# Patient Record
Sex: Female | Born: 1960 | Race: White | Hispanic: No | Marital: Married | Smoking: Never smoker
Health system: Southern US, Community
[De-identification: ages and names within clinical notes are randomized; demographics above are authoritative.]

## PROBLEM LIST (undated history)

## (undated) DIAGNOSIS — Z803 Family history of malignant neoplasm of breast: Secondary | ICD-10-CM

## (undated) DIAGNOSIS — M8580 Other specified disorders of bone density and structure, unspecified site: Secondary | ICD-10-CM

## (undated) DIAGNOSIS — Z8619 Personal history of other infectious and parasitic diseases: Secondary | ICD-10-CM

## (undated) DIAGNOSIS — I471 Supraventricular tachycardia, unspecified: Secondary | ICD-10-CM

## (undated) DIAGNOSIS — Z9889 Other specified postprocedural states: Secondary | ICD-10-CM

## (undated) DIAGNOSIS — M1990 Unspecified osteoarthritis, unspecified site: Secondary | ICD-10-CM

## (undated) DIAGNOSIS — R112 Nausea with vomiting, unspecified: Secondary | ICD-10-CM

## (undated) DIAGNOSIS — T4145XA Adverse effect of unspecified anesthetic, initial encounter: Secondary | ICD-10-CM

## (undated) DIAGNOSIS — T8859XA Other complications of anesthesia, initial encounter: Secondary | ICD-10-CM

## (undated) HISTORY — PX: OTHER SURGICAL HISTORY: SHX169

## (undated) HISTORY — DX: Unspecified osteoarthritis, unspecified site: M19.90

## (undated) HISTORY — DX: Supraventricular tachycardia: I47.1

## (undated) HISTORY — DX: Personal history of other infectious and parasitic diseases: Z86.19

## (undated) HISTORY — DX: Supraventricular tachycardia, unspecified: I47.10

## (undated) HISTORY — DX: Family history of malignant neoplasm of breast: Z80.3

---

## 1998-01-28 ENCOUNTER — Ambulatory Visit (HOSPITAL_COMMUNITY): Admission: RE | Admit: 1998-01-28 | Discharge: 1998-01-28 | Payer: Self-pay | Admitting: Obstetrics and Gynecology

## 1999-06-14 ENCOUNTER — Other Ambulatory Visit: Admission: RE | Admit: 1999-06-14 | Discharge: 1999-06-14 | Payer: Self-pay | Admitting: Obstetrics and Gynecology

## 2000-07-04 ENCOUNTER — Other Ambulatory Visit: Admission: RE | Admit: 2000-07-04 | Discharge: 2000-07-04 | Payer: Self-pay | Admitting: Obstetrics and Gynecology

## 2001-07-26 ENCOUNTER — Other Ambulatory Visit: Admission: RE | Admit: 2001-07-26 | Discharge: 2001-07-26 | Payer: Self-pay | Admitting: Obstetrics and Gynecology

## 2002-09-25 ENCOUNTER — Other Ambulatory Visit: Admission: RE | Admit: 2002-09-25 | Discharge: 2002-09-25 | Payer: Self-pay | Admitting: Obstetrics and Gynecology

## 2003-11-04 ENCOUNTER — Other Ambulatory Visit: Admission: RE | Admit: 2003-11-04 | Discharge: 2003-11-04 | Payer: Self-pay | Admitting: Obstetrics and Gynecology

## 2004-12-21 ENCOUNTER — Other Ambulatory Visit: Admission: RE | Admit: 2004-12-21 | Discharge: 2004-12-21 | Payer: Self-pay | Admitting: Obstetrics and Gynecology

## 2005-05-04 ENCOUNTER — Encounter: Admission: RE | Admit: 2005-05-04 | Discharge: 2005-05-04 | Payer: Self-pay | Admitting: Obstetrics and Gynecology

## 2005-05-23 ENCOUNTER — Encounter: Admission: RE | Admit: 2005-05-23 | Discharge: 2005-05-23 | Payer: Self-pay | Admitting: Obstetrics and Gynecology

## 2005-06-05 ENCOUNTER — Ambulatory Visit: Payer: Self-pay | Admitting: Pulmonary Disease

## 2005-06-06 ENCOUNTER — Ambulatory Visit: Payer: Self-pay | Admitting: Pulmonary Disease

## 2005-08-16 ENCOUNTER — Encounter: Payer: Self-pay | Admitting: Obstetrics and Gynecology

## 2006-05-07 ENCOUNTER — Encounter: Admission: RE | Admit: 2006-05-07 | Discharge: 2006-05-07 | Payer: Self-pay | Admitting: Obstetrics and Gynecology

## 2007-05-09 ENCOUNTER — Encounter: Admission: RE | Admit: 2007-05-09 | Discharge: 2007-05-09 | Payer: Self-pay | Admitting: Obstetrics and Gynecology

## 2008-04-29 ENCOUNTER — Ambulatory Visit: Payer: Self-pay | Admitting: Pulmonary Disease

## 2008-04-30 ENCOUNTER — Ambulatory Visit: Payer: Self-pay | Admitting: Pulmonary Disease

## 2008-04-30 DIAGNOSIS — M1990 Unspecified osteoarthritis, unspecified site: Secondary | ICD-10-CM | POA: Insufficient documentation

## 2008-04-30 DIAGNOSIS — B029 Zoster without complications: Secondary | ICD-10-CM | POA: Insufficient documentation

## 2008-04-30 LAB — CONVERTED CEMR LAB
Alkaline Phosphatase: 41 units/L (ref 39–117)
BUN: 16 mg/dL (ref 6–23)
Barbiturate Quant, Ur: NEGATIVE
Basophils Absolute: 0 10*3/uL (ref 0.0–0.1)
Benzodiazepines.: NEGATIVE
Bilirubin, Direct: 0.2 mg/dL (ref 0.0–0.3)
CO2: 30 meq/L (ref 19–32)
Direct LDL: 126.2 mg/dL
Eosinophils Absolute: 0.1 10*3/uL (ref 0.0–0.7)
GFR calc non Af Amer: 81 mL/min
HCT: 39.6 % (ref 36.0–46.0)
Hep B S Ab: POSITIVE — AB
Lymphocytes Relative: 39.7 % (ref 12.0–46.0)
Methadone: NEGATIVE
Monocytes Absolute: 0.2 10*3/uL (ref 0.1–1.0)
Neutro Abs: 1.6 10*3/uL (ref 1.4–7.7)
Opiate Screen, Urine: NEGATIVE
Phencyclidine (PCP): NEGATIVE
Platelets: 151 10*3/uL (ref 150–400)
Potassium: 4.2 meq/L (ref 3.5–5.1)
Propoxyphene: NEGATIVE
Sodium: 140 meq/L (ref 135–145)
Total Protein: 7 g/dL (ref 6.0–8.3)
Triglycerides: 40 mg/dL (ref 0–149)
VLDL: 8 mg/dL (ref 0–40)
WBC: 3.1 10*3/uL — ABNORMAL LOW (ref 4.5–10.5)

## 2008-05-06 ENCOUNTER — Telehealth: Payer: Self-pay | Admitting: Pulmonary Disease

## 2008-05-07 ENCOUNTER — Ambulatory Visit: Payer: Self-pay | Admitting: Pulmonary Disease

## 2008-05-07 LAB — CONVERTED CEMR LAB
Rubeola IgG: 2.83 — ABNORMAL HIGH
Varicella IgG: 4.31 — ABNORMAL HIGH

## 2008-05-11 ENCOUNTER — Encounter: Admission: RE | Admit: 2008-05-11 | Discharge: 2008-05-11 | Payer: Self-pay | Admitting: Obstetrics and Gynecology

## 2009-05-12 ENCOUNTER — Encounter: Admission: RE | Admit: 2009-05-12 | Discharge: 2009-05-12 | Payer: Self-pay | Admitting: Obstetrics and Gynecology

## 2009-05-13 ENCOUNTER — Ambulatory Visit: Payer: Self-pay | Admitting: Pulmonary Disease

## 2010-05-19 NOTE — Miscellaneous (Signed)
Summary: Orders Update  Clinical Lists Changes  Orders: Added new Test order of [employer]-Foot Right (73630TC) - Signed

## 2010-07-06 ENCOUNTER — Other Ambulatory Visit (HOSPITAL_COMMUNITY): Payer: Self-pay | Admitting: Obstetrics and Gynecology

## 2010-07-06 DIAGNOSIS — Z1231 Encounter for screening mammogram for malignant neoplasm of breast: Secondary | ICD-10-CM

## 2010-07-29 ENCOUNTER — Ambulatory Visit (HOSPITAL_COMMUNITY)
Admission: RE | Admit: 2010-07-29 | Discharge: 2010-07-29 | Disposition: A | Discharge status: Home / Self Care | Payer: 59 | Source: Ambulatory Visit | Attending: Obstetrics and Gynecology | Admitting: Obstetrics and Gynecology

## 2010-07-29 DIAGNOSIS — Z1231 Encounter for screening mammogram for malignant neoplasm of breast: Secondary | ICD-10-CM | POA: Insufficient documentation

## 2011-07-11 ENCOUNTER — Other Ambulatory Visit (HOSPITAL_COMMUNITY): Payer: Self-pay | Admitting: Obstetrics and Gynecology

## 2011-07-11 DIAGNOSIS — Z1231 Encounter for screening mammogram for malignant neoplasm of breast: Secondary | ICD-10-CM

## 2011-08-07 ENCOUNTER — Ambulatory Visit (HOSPITAL_COMMUNITY)
Admission: RE | Admit: 2011-08-07 | Discharge: 2011-08-07 | Disposition: A | Discharge status: Home / Self Care | Payer: 59 | Source: Ambulatory Visit | Attending: Obstetrics and Gynecology | Admitting: Obstetrics and Gynecology

## 2011-08-07 DIAGNOSIS — Z1231 Encounter for screening mammogram for malignant neoplasm of breast: Secondary | ICD-10-CM | POA: Insufficient documentation

## 2012-01-22 ENCOUNTER — Other Ambulatory Visit: Payer: Self-pay | Admitting: Pulmonary Disease

## 2012-01-22 DIAGNOSIS — Z0000 Encounter for general adult medical examination without abnormal findings: Secondary | ICD-10-CM

## 2012-01-25 ENCOUNTER — Other Ambulatory Visit (INDEPENDENT_AMBULATORY_CARE_PROVIDER_SITE_OTHER): Payer: 59

## 2012-01-25 DIAGNOSIS — Z0000 Encounter for general adult medical examination without abnormal findings: Secondary | ICD-10-CM

## 2012-01-25 LAB — CBC WITH DIFFERENTIAL/PLATELET
Basophils Absolute: 0 10*3/uL (ref 0.0–0.1)
Eosinophils Absolute: 0.1 10*3/uL (ref 0.0–0.7)
Lymphocytes Relative: 40.6 % (ref 12.0–46.0)
MCV: 95.4 fl (ref 78.0–100.0)
Neutrophils Relative %: 47.7 % (ref 43.0–77.0)
Platelets: 177 10*3/uL (ref 150.0–400.0)
RBC: 4.51 Mil/uL (ref 3.87–5.11)
RDW: 13.2 % (ref 11.5–14.6)

## 2012-01-25 LAB — LDL CHOLESTEROL, DIRECT: Direct LDL: 143.9 mg/dL

## 2012-01-25 LAB — LIPID PANEL: Triglycerides: 45 mg/dL (ref 0.0–149.0)

## 2012-01-25 LAB — HEPATIC FUNCTION PANEL
ALT: 12 U/L (ref 0–35)
AST: 19 U/L (ref 0–37)
Albumin: 4.2 g/dL (ref 3.5–5.2)
Bilirubin, Direct: 0.2 mg/dL (ref 0.0–0.3)
Total Bilirubin: 1 mg/dL (ref 0.3–1.2)

## 2012-01-25 LAB — BASIC METABOLIC PANEL
BUN: 16 mg/dL (ref 6–23)
CO2: 29 mEq/L (ref 19–32)
Calcium: 9.5 mg/dL (ref 8.4–10.5)
GFR: 76.51 mL/min (ref >60.00)
Potassium: 4.6 mEq/L (ref 3.5–5.1)
Sodium: 139 mEq/L (ref 135–145)

## 2012-01-25 LAB — URINALYSIS
Nitrite: NEGATIVE
Specific Gravity, Urine: 1.01 (ref 1.000–1.030)
Urine Glucose: NEGATIVE
Urobilinogen, UA: 0.2 (ref 0.0–1.0)

## 2012-01-26 ENCOUNTER — Ambulatory Visit (INDEPENDENT_AMBULATORY_CARE_PROVIDER_SITE_OTHER): Payer: 59 | Admitting: Pulmonary Disease

## 2012-01-26 ENCOUNTER — Ambulatory Visit (INDEPENDENT_AMBULATORY_CARE_PROVIDER_SITE_OTHER)
Admission: RE | Admit: 2012-01-26 | Discharge: 2012-01-26 | Disposition: A | Discharge status: Home / Self Care | Payer: 59 | Source: Ambulatory Visit | Attending: Pulmonary Disease | Admitting: Pulmonary Disease

## 2012-01-26 ENCOUNTER — Encounter: Payer: Self-pay | Admitting: *Deleted

## 2012-01-26 VITALS — BP 110/72 | HR 66 | Temp 97.7°F | Ht 65.0 in | Wt 162.2 lb

## 2012-01-26 DIAGNOSIS — E7800 Pure hypercholesterolemia, unspecified: Secondary | ICD-10-CM

## 2012-01-26 DIAGNOSIS — Z0000 Encounter for general adult medical examination without abnormal findings: Secondary | ICD-10-CM

## 2012-01-26 DIAGNOSIS — R1310 Dysphagia, unspecified: Secondary | ICD-10-CM | POA: Insufficient documentation

## 2012-01-26 DIAGNOSIS — Z8679 Personal history of other diseases of the circulatory system: Secondary | ICD-10-CM

## 2012-01-26 LAB — VITAMIN D 25 HYDROXY (VIT D DEFICIENCY, FRACTURES): Vit D, 25-Hydroxy: 32 ng/mL (ref 30–89)

## 2012-01-26 NOTE — Progress Notes (Signed)
Subjective:     Patient ID: Christy Morales, female   DOB: Aug 26, 1960, 51 y.o.   MRN: [id number]  HPI 43 y/o WF, wife of Dr. Marcelyn Morales, here for a CPX...   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 she enjoys excellent general medical health and her only complaint is that of intermittent but somewhat freq episodes of palpitations- see below.  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  Her CC= one month hx of intermittent swallowing difficulty, like she's having a hard time swallowing, described as a pressure sensation in upper esoph & throat- noted more w/ dry foods & helped sl by fluids; denies choking/ coughing/ vomiting, ?globus sensation?; states she tried Zyrtek but not Zantac;  We discussed further eval w/ Ba Swallow, & treatment w/ PPI vs H2Blocker and a combination relaxer for the cricopharyngeus (eg- Klonopin) but she doesn't want meds & prefers checking the BaSwallow first... (PE is neg w/o goiter, nodule, etc)    Hx Palpit> rare episodes of rapid tachycardia sound like PAT, usually resolve by coughing (valsalva), only occurs very rarely now 7 she knows to avoid caffeine etc...    Hyperlipidemia> on diet alone but wt up 20# & FLP is worse w/ TChol 232, TG 45, HDL 71, LDL 144    DJD> she had right knee arthroscopy in the past tear), denies recurrent problems...     +FamHx heart dis & breast cancer> mother had MI & CABG, and hx breast cancer; she gets yearly mammography & does monthly self exam etc; we reviewed coronary risk factors & her FLP but she wants to work on diet & wt reduction first...    We reviewed prob list, meds, xrays and labs> see below for updates >> she declined flu  vaccine... CXR 10/13 showed normal heart size, clear lungs & essentially WNL.Marland KitchenMarland Kitchen EKG 10/13 showed SBrady, rate54, wnl, NAD... LABS 10/13:  FLP- not at goals, on diet Rx;  Chems- wnl;  CBC- wnl;  TSH=2.14;  VitD=32;  UA- clear... Barium Swallow> pending          Problem List:    ?Globus sensation/ Dysphagia >> ~  10/13:  See above...  R/O PAROXYSMAL ATRIAL TACHYCARDIA (ICD-427.0) - she notes occas episodes of palpitations described as rapid and hard which she can eliminate by coughing... these occur several times per week  and last only seconds because she will cough & the paroxysm will stop... this has been going on for several months... she denies CP, dizziness, syncope, SOB/ change in DOE, edema... no prev hx of cardiac arrhythmia etc... we discussed PAT & how a valsalva will break the arrhythmia- she would require Holter vs Event recorder to diagnose & document; and consideration of BBlocker, Calcium channel blocker, etc... for now she would like to try eliminating caffeine & see how she does (also reminded to avoid decongestants). ~  baseline EKG shows NSR, p waves w/ sl variable morphology, no STTW abnormalities.  HYPERLIPIDEMIA >> on diet alone... ~  FLP 1/10 showed TChol 209, TG 40, HDL 70, LDL 126 ~  FLP 10/13 showed TChol 232, TG 45, HDL 71, LDL 144  BREAST CANCER, FAMILY HX (  ICD-V16.3) - Mother passed away at age 66 w/ MI & CABG, but she also had a hx of breast cancer... pt has very dense breast tissue by mammography and [facility].  DEGENERATIVE JOINT DISEASE (ICD-715.90) - she had a right knee arthroscopy by Christy Morales in the past (meniscus tear)...  Hx of SHINGLES (ICD-053.9) - left T9-10 level shingles eruption in 1997... she has not had the Zoster vaccine.  Health Maintenance: ~  GYN= Christy Morales w/ PAP due now... perimenopausal, not on hormone therapy... ~  [facility]- dense fibroglandular tissue, no lesions... +Fam  hx breast cancer in her mother who passes away at 59 from MI, heart disease... ~  BMD done at Gyn office due to +FamHx of osteoporosis in mother... initial BMD reported normal, f/u w/ min osteopenia... on Calcium, Vits... ~  Colonoscopy:  stools neg via her Gyn office... screening colon due ~age 21... ~  Vaccinations:  she has vaccination record at home...      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      >>DT booster given today 04/29/08...      >>PPD skin test placed - read 05/01/08 as negative... & CXR is clear.   Past Medical History  Diagnosis Date  . Paroxysmal supraventricular tachycardia   . Family history of breast cancer   . DJD (degenerative joint disease)   . History of shingles     Past Surgical History  Procedure Date  . Tubal pregnancy with d & c   . Right knee arthroscopy for meniscus tear     Dr. Rennis Morales    No outpatient encounter prescriptions on file as of 01/26/2012.    Allergies  Allergen Reactions  . Sulfamethoxazole W-Trimethoprim     REACTION: rash    Current Medications, Allergies, Past Medical History, Past Surgical History, Family History, and Social History were reviewed in [employer] record.   Review of Systems        The patient complains of palpitations.  The patient denies fever, chills, sweats, anorexia, fatigue, weakness, malaise, weight loss, sleep disorder, blurring, diplopia, eye irritation, eye discharge, vision loss, eye pain, photophobia, earache, ear discharge, tinnitus, decreased hearing, nasal congestion, nosebleeds, sore throat, hoarseness, chest pain, syncope, dyspnea on exertion, orthopnea, PND, peripheral edema, cough, dyspnea at rest, excessive sputum, hemoptysis, wheezing, pleurisy, nausea, vomiting, diarrhea, constipation, change in bowel habits, abdominal pain, melena, hematochezia, jaundice, gas/bloating, indigestion/heartburn,  dysphagia, odynophagia, dysuria, hematuria, urinary frequency, urinary hesitancy, nocturia, incontinence, back pain, joint pain, joint swelling, muscle cramps, muscle weakness, stiffness, arthritis, sciatica, restless legs, leg pain at night, leg pain with exertion, rash, itching, dryness, suspicious lesions, paralysis, paresthesias, seizures, tremors, vertigo, transient blindness, frequent falls, frequent headaches, difficulty walking, depression, anxiety, memory loss, confusion, cold intolerance, heat intolerance, polydipsia, polyphagia, polyuria, unusual weight change, abnormal bruising, bleeding, enlarged lymph nodes, urticaria, allergic rash, hay fever, and recurrent infections.     Objective:   Physical Exam    WD, WN, 51 y/o WF in NAD... GENERAL:  Alert & oriented; pleasant & cooperative... HEENT:  [place]/AT, EOM-wnl, PERRLA, EACs-clear, TMs-wnl, NOSE-clear, THROAT-clear & wnl. NECK:  Supple w/ full ROM; no JVD; normal carotid impulses w/o bruits; no thyromegaly or nodules palpated; no lymphadenopathy. CHEST:  Clear to P & A; without wheezes/ rales/ or rhonchi. HEART:  Regular Rhythm; without murmurs/ rubs/ or gallops. ABDOMEN:  Soft & nontender; normal bowel  sounds; no organomegaly or masses detected. EXT: without deformities or arthritic changes; no varicose veins/ venous insuffic/ or edema. NEURO:  CN's intact; motor testing normal; sensory testing normal; gait normal & balance OK. DERM:  No lesions noted; no rash etc...  RADIOLOGY DATA:  Reviewed in the EPIC EMR & discussed w/ the patient...  LABORATORY DATA:  Reviewed in the EPIC EMR & discussed w/ the patient...   Assessment:     CPX age 66>> Good general health...  ?Dysphagia vs Globus sensation>  We will pursue Ba swallow & she will consider med rx once this is done...  HxPAT>  Very rare episodes now, reviewed rec to avoid caffeine, etc...  Hyperlipid>  Committed to diet, exercise, wt reduction, does not want meds...  Need  for Colonoscopy>  She is 51 y/o now & we will refer to [facility] for colonoscopy...  Other medical issues as noted...     Plan:     Patient's Medications  New Prescriptions   No medications on file  Previous Medications   CALCIUM CARBONATE (OS-CAL) 1250 MG CHEWABLE TABLET    Chew 1 tablet by mouth daily.   MULTIPLE VITAMINS-MINERALS (MULTIVITAMIN & MINERAL PO)    Take one tablet 2-3 times per week  Modified Medications   No medications on file  Discontinued Medications   No medications on file

## 2012-01-26 NOTE — Patient Instructions (Addendum)
Christy Morales, it was great seeing you again...  Today we did your follow up physical exam & everything checked out OK...  We gave you a copy of your FASTING blood work>    Let's work on weight reduction w/ low chol, low fat diet and exercise program...  Today we did your follow up CXR & EKG> we will call you w/ the results...  We will sched a Barium Swallow for further evaluation of your swallowing difficulty...    If it is neg, I would rec a trial of Klonopin for the Cricopharyngeus muscle spasm...  We will also set up a GI appt w/ DrMagod for a colonoscopy (he may need to add an EGD to this if the swallowing prob persists)...  Call for any questions.Marland KitchenMarland Kitchen

## 2012-01-28 ENCOUNTER — Encounter: Payer: Self-pay | Admitting: Pulmonary Disease

## 2012-01-28 DIAGNOSIS — E7800 Pure hypercholesterolemia, unspecified: Secondary | ICD-10-CM | POA: Insufficient documentation

## 2012-01-28 DIAGNOSIS — Z8679 Personal history of other diseases of the circulatory system: Secondary | ICD-10-CM | POA: Insufficient documentation

## 2012-02-01 ENCOUNTER — Ambulatory Visit (HOSPITAL_COMMUNITY)
Admission: RE | Admit: 2012-02-01 | Discharge: 2012-02-01 | Disposition: A | Discharge status: Home / Self Care | Payer: 59 | Source: Ambulatory Visit | Attending: Pulmonary Disease | Admitting: Pulmonary Disease

## 2012-02-01 DIAGNOSIS — K224 Dyskinesia of esophagus: Secondary | ICD-10-CM | POA: Insufficient documentation

## 2012-02-01 DIAGNOSIS — R1310 Dysphagia, unspecified: Secondary | ICD-10-CM | POA: Insufficient documentation

## 2012-03-05 ENCOUNTER — Telehealth: Payer: Self-pay | Admitting: Pulmonary Disease

## 2012-03-05 NOTE — Telephone Encounter (Signed)
Will forward to SN as FYI 

## 2012-03-05 NOTE — Telephone Encounter (Signed)
SN is aware. 

## 2013-06-18 ENCOUNTER — Encounter (HOSPITAL_COMMUNITY): Payer: Self-pay | Admitting: Emergency Medicine

## 2013-06-18 ENCOUNTER — Emergency Department (HOSPITAL_COMMUNITY)
Admission: EM | Admit: 2013-06-18 | Discharge: 2013-06-18 | Disposition: A | Discharge status: Home / Self Care | Payer: 59 | Source: Home / Self Care | Attending: Family Medicine | Admitting: Family Medicine

## 2013-06-18 DIAGNOSIS — S81052A Open bite, left knee, initial encounter: Secondary | ICD-10-CM

## 2013-06-18 DIAGNOSIS — S81009A Unspecified open wound, unspecified knee, initial encounter: Secondary | ICD-10-CM

## 2013-06-18 DIAGNOSIS — S91009A Unspecified open wound, unspecified ankle, initial encounter: Secondary | ICD-10-CM

## 2013-06-18 DIAGNOSIS — S81809A Unspecified open wound, unspecified lower leg, initial encounter: Secondary | ICD-10-CM

## 2013-06-18 DIAGNOSIS — W540XXA Bitten by dog, initial encounter: Secondary | ICD-10-CM

## 2013-06-18 NOTE — ED Provider Notes (Signed)
CSN: [id number]     Arrival date & time 06/18/13  1528 History   First MD Initiated Contact with Patient 06/18/13 1644     Chief Complaint  Patient presents with  . Animal Bite   (Consider location/radiation/quality/duration/timing/severity/associated sxs/prior Treatment) HPI Comments: Patient states she was walking her dog today when she was approached and bitten on her left knee by her neighbors dog. The dog that bit her has been reported to her to have been fully immunized and is under quarantine with animal control. Her dog is being observed at her vet's office. She states her last tetanus booster was less than 5 years ago. She was wearing long pants at the time of injury and fabric of pants remained intact. She suffered two small superficial abrasions to the medial surface of her left knee. No active bleeding or puncture wounds.   The history is provided by the patient.    Past Medical History  Diagnosis Date  . Paroxysmal supraventricular tachycardia   . Family history of breast cancer   . DJD (degenerative joint disease)   . History of shingles    Past Surgical History  Procedure Laterality Date  . Tubal pregnancy with d & c    . Right knee arthroscopy for meniscus tear      Dr. Rennis ChrisSupple   History reviewed. No pertinent family history. History  Substance Use Topics  . Smoking status: Never Smoker   . Smokeless tobacco: Not on file  . Alcohol Use: Yes     Comment: social   OB History   Grav Para Term Preterm Abortions TAB SAB Ect Mult Living                 Review of Systems  All other systems reviewed and are negative.    Allergies  Penicillins and Sulfamethoxazole-trimethoprim  Home Medications   Current Outpatient Rx  Name  Route  Sig  Dispense  Refill  . calcium carbonate (OS-CAL) 1250 MG chewable tablet   Oral   Chew 1 tablet by mouth daily.         . Multiple Vitamins-Minerals (MULTIVITAMIN & MINERAL PO)      Take one tablet 2-3 times per week          BP 130/75  Pulse 59  Temp(Src) 97.9 F (36.6 C) (Oral)  Resp 16  SpO2 100% Physical Exam  Nursing note and vitals reviewed. Constitutional: She is oriented to person, place, and time. She appears well-developed and well-nourished. No distress.  HENT:  Head: Normocephalic and atraumatic.  Eyes: Conjunctivae are normal.  Cardiovascular: Normal rate.   Pulmonary/Chest: Effort normal.  Musculoskeletal: Normal range of motion.       Left knee: She exhibits ecchymosis. She exhibits normal range of motion, no effusion, no deformity, no laceration and no erythema.       Legs: No dysfunction of left knee  Neurological: She is alert and oriented to person, place, and time.  Skin: Skin is warm and dry.  Psychiatric: She has a normal mood and affect. Her behavior is normal.    ED Course  Procedures (including critical care time) Labs Review Labs Reviewed - No data to display Imaging Review No results found.   MDM   1. Dog bite of left knee    Abrasion left knee: patient's tetanus status is UTD and dog that bit patient is reported to have been UTD with regard to rabies immunization. Wounds are superficial and given that fabric of patient's  pants remained intact and that only 5% of dog bites become infected, I explained that her risk of infection is quite low. Reviewed wound care instructions for abrasions with patient and provided her with several packets of bacitracin ointment for use at home.   Jess Barters [place], [place] 06/18/13 [phone]

## 2013-06-18 NOTE — ED Notes (Addendum)
Pt  Reports  She  Was  Bitten  By a  Dog     Bite    l  Knee    Today  She  Was  Walking  Her  Dog  When  Neighbors  Dog  Attacked  Her  Dog then  Bit  Her       She  Reports  Animal  Control  Notified  Already   Dog  According to  [employer]  Has  Had  Immunizations   And  [employer]  [employer]      Are  Taking the  Dog  To  The  Vet

## 2013-06-19 NOTE — ED Provider Notes (Signed)
Medical screening examination/treatment/procedure(s) were performed by a resident physician or non-physician practitioner and as the supervising physician I was immediately available for consultation/collaboration.  Evan Corey, MD    Evan S Corey, MD 06/19/13 0748 

## 2013-09-15 LAB — HM MAMMOGRAPHY

## 2013-09-15 LAB — HM PAP SMEAR: HM PAP: NEGATIVE

## 2013-10-24 ENCOUNTER — Telehealth: Payer: Self-pay | Admitting: Pulmonary Disease

## 2013-10-24 NOTE — Telephone Encounter (Signed)
Pt would like to know if dr. Fabian Sharppanosh would accept her as a transfer pt from [facility]. Pt husband is a pcp at [facility].

## 2014-06-05 LAB — HM COLONOSCOPY

## 2014-06-12 ENCOUNTER — Ambulatory Visit (INDEPENDENT_AMBULATORY_CARE_PROVIDER_SITE_OTHER): Payer: 59 | Admitting: Family Medicine

## 2014-06-12 ENCOUNTER — Encounter: Payer: Self-pay | Admitting: Family Medicine

## 2014-06-12 VITALS — BP 129/78 | HR 76 | Temp 99.4°F | Ht 65.0 in | Wt 150.8 lb

## 2014-06-12 DIAGNOSIS — E7800 Pure hypercholesterolemia, unspecified: Secondary | ICD-10-CM

## 2014-06-12 DIAGNOSIS — E785 Hyperlipidemia, unspecified: Secondary | ICD-10-CM

## 2014-06-12 DIAGNOSIS — M8580 Other specified disorders of bone density and structure, unspecified site: Secondary | ICD-10-CM

## 2014-06-12 DIAGNOSIS — Z0000 Encounter for general adult medical examination without abnormal findings: Secondary | ICD-10-CM

## 2014-06-12 NOTE — Progress Notes (Signed)
Pre visit review using our clinic review tool, if applicable. No additional management support is needed unless otherwise documented below in the visit note. 

## 2014-06-12 NOTE — Patient Instructions (Signed)
Osteoporosis Throughout your life, your body breaks down old bone and replaces it with new bone. As you get older, your body does not replace bone as quickly as it breaks it down. By the age of 30 years, most people begin to gradually lose bone because of the imbalance between bone loss and replacement. Some people lose more bone than others. Bone loss beyond a specified normal degree is considered osteoporosis.  Osteoporosis affects the strength and durability of your bones. The inside of the ends of your bones and your flat bones, like the bones of your pelvis, look like honeycomb, filled with tiny open spaces. As bone loss occurs, your bones become less dense. This means that the open spaces inside your bones become bigger and the walls between these spaces become thinner. This makes your bones weaker. Bones of a person with osteoporosis can become so weak that they can break (fracture) during minor accidents, such as a simple fall. CAUSES  The following factors have been associated with the development of osteoporosis:  Smoking.  Drinking more than 2 alcoholic drinks several days per week.  Long-term use of certain medicines:  Corticosteroids.  Chemotherapy medicines.  Thyroid medicines.  Antiepileptic medicines.  Gonadal hormone suppression medicine.  Immunosuppression medicine.  Being underweight.  Lack of physical activity.  Lack of exposure to the sun. This can lead to vitamin D deficiency.  Certain medical conditions:  Certain inflammatory bowel diseases, such as Crohn disease and ulcerative colitis.  Diabetes.  Hyperthyroidism.  Hyperparathyroidism. RISK FACTORS Anyone can develop osteoporosis. However, the following factors can increase your risk of developing osteoporosis:  Gender--Women are at higher risk than men.  Age--Being older than 50 years increases your risk.  Ethnicity--White and Asian people have an increased risk.  Weight --Being extremely  underweight can increase your risk of osteoporosis.  Family history of osteoporosis--Having a family member who has developed osteoporosis can increase your risk. SYMPTOMS  Usually, people with osteoporosis have no symptoms.  DIAGNOSIS  Signs during a physical exam that may prompt your caregiver to suspect osteoporosis include:  Decreased height. This is usually caused by the compression of the bones that form your spine (vertebrae) because they have weakened and become fractured.  A curving or rounding of the upper back (kyphosis). To confirm signs of osteoporosis, your caregiver may request a procedure that uses 2 low-dose X-ray beams with different levels of energy to measure your bone mineral density (dual-energy X-ray absorptiometry [DXA]). Also, your caregiver may check your level of vitamin D. TREATMENT  The goal of osteoporosis treatment is to strengthen bones in order to decrease the risk of bone fractures. There are different types of medicines available to help achieve this goal. Some of these medicines work by slowing the processes of bone loss. Some medicines work by increasing bone density. Treatment also involves making sure that your levels of calcium and vitamin D are adequate. PREVENTION  There are things you can do to help prevent osteoporosis. Adequate intake of calcium and vitamin D can help you achieve optimal bone mineral density. Regular exercise can also help, especially resistance and weight-bearing activities. If you smoke, quitting smoking is an important part of osteoporosis prevention. MAKE SURE YOU:  Understand these instructions.  Will watch your condition.  Will get help right away if you are not doing well or get worse. FOR MORE INFORMATION www.osteo.org and www.nof.org Document Released: 01/11/2005 Document Revised: 07/29/2012 Document Reviewed: 03/18/2011 ExitCare Patient Information 2015 [employer]. This information is not   intended to replace advice  given to you by your health care provider. Make sure you discuss any questions you have with your health care provider.  

## 2014-06-13 NOTE — Assessment & Plan Note (Signed)
Recheck labs 

## 2014-06-13 NOTE — Progress Notes (Signed)
   Subjective:    Patient ID: Christy Morales, female    DOB: 01-03-1961, 54 y.o.   MRN: [id number]  HPI  Patient here to establish care.  No complaints.   Past Medical History  Diagnosis Date  . Paroxysmal supraventricular tachycardia   . Family history of breast cancer   . DJD (degenerative joint disease)   . History of shingles   . Osteoporosis     lower spine    Review of Systems  Constitutional: Negative for activity change, appetite change, fatigue and unexpected weight change.  Respiratory: Negative for cough and shortness of breath.   Cardiovascular: Negative for chest pain and palpitations.  Psychiatric/Behavioral: Negative for behavioral problems and dysphoric mood. The patient is not nervous/anxious.        Objective:    Physical Exam  Constitutional: She is oriented to person, place, and time. She appears well-developed and well-nourished. No distress.  HENT:  Right Ear: External ear normal.  Left Ear: External ear normal.  Nose: Nose normal.  Mouth/Throat: Oropharynx is clear and moist.  Eyes: EOM are normal. Pupils are equal, round, and reactive to light.  Neck: Normal range of motion. Neck supple.  Cardiovascular: Normal rate, regular rhythm and normal heart sounds.   No murmur heard. Pulmonary/Chest: Effort normal and breath sounds normal. No respiratory distress. She has no wheezes. She has no rales. She exhibits no tenderness.  Neurological: She is alert and oriented to person, place, and time.  Psychiatric: She has a normal mood and affect. Her behavior is normal. Judgment and thought content normal.    BP 129/78 mmHg  Pulse 76  Temp(Src) 99.4 F (37.4 C) (Oral)  Ht 5\' 5"  (1.651 m)  Wt 150 lb 12.8 oz (68.402 kg)  BMI 25.09 kg/m2  SpO2 100%  LMP  Wt Readings from Last 3 Encounters:  06/12/14 150 lb 12.8 oz (68.402 kg)  01/26/12 162 lb 3.2 oz (73.573 kg)  04/29/08 142 lb (64.411 kg)     Lab Results  Component Value Date   WBC 3.4* 01/25/2012     HGB 14.6 01/25/2012   HCT 43.0 01/25/2012   PLT 177.0 01/25/2012   GLUCOSE 89 01/25/2012   CHOL 232* 01/25/2012   TRIG 45.0 01/25/2012   HDL 71.40 01/25/2012   LDLDIRECT 143.9 01/25/2012   ALT 12 01/25/2012   AST 19 01/25/2012   NA 139 01/25/2012   K 4.6 01/25/2012   CL 104 01/25/2012   CREATININE 0.8 01/25/2012   BUN 16 01/25/2012   CO2 29 01/25/2012   TSH 2.14 01/25/2012    No results found.     Assessment & Plan:   Problem List Items Addressed This Visit    Osteopenia - Primary   Relevant Orders   Vitamin D (25 hydroxy)   Hypercholesterolemia    Recheck labs       Other Visit Diagnoses    Hyperlipidemia        Relevant Orders    Basic metabolic panel    CBC with Differential/Platelet    Hepatic function panel    Lipid panel    Preventative health care        Relevant Orders    HIV antibody        Loreen FreudYvonne Lowne, DO

## 2014-06-22 ENCOUNTER — Other Ambulatory Visit (INDEPENDENT_AMBULATORY_CARE_PROVIDER_SITE_OTHER): Payer: 59

## 2014-06-22 ENCOUNTER — Encounter: Payer: Self-pay | Admitting: Family Medicine

## 2014-06-22 DIAGNOSIS — Z0000 Encounter for general adult medical examination without abnormal findings: Secondary | ICD-10-CM

## 2014-06-22 DIAGNOSIS — M8580 Other specified disorders of bone density and structure, unspecified site: Secondary | ICD-10-CM

## 2014-06-22 DIAGNOSIS — E785 Hyperlipidemia, unspecified: Secondary | ICD-10-CM

## 2014-06-22 LAB — HEPATIC FUNCTION PANEL
ALBUMIN: 4.6 g/dL (ref 3.5–5.2)
ALT: 9 U/L (ref 0–35)
AST: 16 U/L (ref 0–37)
Alkaline Phosphatase: 49 U/L (ref 39–117)
BILIRUBIN DIRECT: 0.1 mg/dL (ref 0.0–0.3)
Total Bilirubin: 0.7 mg/dL (ref 0.2–1.2)
Total Protein: 7.4 g/dL (ref 6.0–8.3)

## 2014-06-22 LAB — CBC WITH DIFFERENTIAL/PLATELET
Basophils Absolute: 0 10*3/uL (ref 0.0–0.1)
Basophils Relative: 1.1 % (ref 0.0–3.0)
EOS PCT: 3.4 % (ref 0.0–5.0)
Eosinophils Absolute: 0.1 10*3/uL (ref 0.0–0.7)
HEMATOCRIT: 41.5 % (ref 36.0–46.0)
HEMOGLOBIN: 14.4 g/dL (ref 12.0–15.0)
LYMPHS ABS: 1.6 10*3/uL (ref 0.7–4.0)
Lymphocytes Relative: 46.4 % — ABNORMAL HIGH (ref 12.0–46.0)
MCHC: 34.6 g/dL (ref 30.0–36.0)
MCV: 92.6 fl (ref 78.0–100.0)
MONOS PCT: 6.9 % (ref 3.0–12.0)
Monocytes Absolute: 0.2 10*3/uL (ref 0.1–1.0)
NEUTROS ABS: 1.4 10*3/uL (ref 1.4–7.7)
Neutrophils Relative %: 42.2 % — ABNORMAL LOW (ref 43.0–77.0)
Platelets: 200 10*3/uL (ref 150.0–400.0)
RBC: 4.48 Mil/uL (ref 3.87–5.11)
RDW: 13.4 % (ref 11.5–15.5)
WBC: 3.4 10*3/uL — ABNORMAL LOW (ref 4.0–10.5)

## 2014-06-22 LAB — BASIC METABOLIC PANEL
BUN: 15 mg/dL (ref 6–23)
CALCIUM: 10 mg/dL (ref 8.4–10.5)
CHLORIDE: 105 meq/L (ref 96–112)
CO2: 31 mEq/L (ref 19–32)
CREATININE: 0.89 mg/dL (ref 0.40–1.20)
GFR: 69.95 mL/min (ref >60.00)
Glucose, Bld: 95 mg/dL (ref 70–99)
Potassium: 4.4 mEq/L (ref 3.5–5.1)
Sodium: 140 mEq/L (ref 135–145)

## 2014-06-22 LAB — VITAMIN D 25 HYDROXY (VIT D DEFICIENCY, FRACTURES): VITD: 42.04 ng/mL (ref 30.00–100.00)

## 2014-06-22 LAB — HIV ANTIBODY (ROUTINE TESTING W REFLEX): HIV: NONREACTIVE

## 2014-06-22 LAB — LIPID PANEL
CHOL/HDL RATIO: 3
CHOLESTEROL: 200 mg/dL (ref 0–200)
HDL: 66.6 mg/dL (ref >39.00)
LDL CALC: 117 mg/dL — AB (ref 0–99)
NonHDL: 133.4
TRIGLYCERIDES: 81 mg/dL (ref 0.0–149.0)
VLDL: 16.2 mg/dL (ref 0.0–40.0)

## 2014-07-22 ENCOUNTER — Encounter: Payer: Self-pay | Admitting: Family Medicine

## 2015-06-15 ENCOUNTER — Encounter: Payer: 59 | Admitting: Family Medicine

## 2016-01-20 ENCOUNTER — Ambulatory Visit (INDEPENDENT_AMBULATORY_CARE_PROVIDER_SITE_OTHER): Payer: Federal, State, Local not specified - PPO | Admitting: Family Medicine

## 2016-01-20 ENCOUNTER — Encounter: Payer: Self-pay | Admitting: Family Medicine

## 2016-01-20 VITALS — BP 121/76 | HR 51 | Temp 97.9°F | Resp 16 | Ht 65.0 in | Wt 155.8 lb

## 2016-01-20 DIAGNOSIS — Z0000 Encounter for general adult medical examination without abnormal findings: Secondary | ICD-10-CM | POA: Diagnosis not present

## 2016-01-20 DIAGNOSIS — Z803 Family history of malignant neoplasm of breast: Secondary | ICD-10-CM

## 2016-01-20 LAB — CBC WITH DIFFERENTIAL/PLATELET
BASOS ABS: 0 10*3/uL (ref 0.0–0.1)
Basophils Relative: 0.9 % (ref 0.0–3.0)
EOS ABS: 0.1 10*3/uL (ref 0.0–0.7)
Eosinophils Relative: 1.4 % (ref 0.0–5.0)
HEMATOCRIT: 42.5 % (ref 36.0–46.0)
Hemoglobin: 14.5 g/dL (ref 12.0–15.0)
LYMPHS PCT: 32.3 % (ref 12.0–46.0)
Lymphs Abs: 1.6 10*3/uL (ref 0.7–4.0)
MCHC: 34.2 g/dL (ref 30.0–36.0)
MCV: 93.8 fl (ref 78.0–100.0)
Monocytes Absolute: 0.3 10*3/uL (ref 0.1–1.0)
Monocytes Relative: 5.8 % (ref 3.0–12.0)
NEUTROS ABS: 3 10*3/uL (ref 1.4–7.7)
Neutrophils Relative %: 59.6 % (ref 43.0–77.0)
PLATELETS: 201 10*3/uL (ref 150.0–400.0)
RBC: 4.53 Mil/uL (ref 3.87–5.11)
RDW: 13.4 % (ref 11.5–15.5)
WBC: 5 10*3/uL (ref 4.0–10.5)

## 2016-01-20 LAB — POCT URINALYSIS DIPSTICK
Bilirubin, UA: NEGATIVE
Blood, UA: NEGATIVE
GLUCOSE UA: NEGATIVE
Ketones, UA: NEGATIVE
LEUKOCYTES UA: NEGATIVE
Nitrite, UA: NEGATIVE
Protein, UA: NEGATIVE
SPEC GRAV UA: 1.025
Urobilinogen, UA: NEGATIVE
pH, UA: 6

## 2016-01-20 LAB — COMPREHENSIVE METABOLIC PANEL
ALBUMIN: 4.4 g/dL (ref 3.5–5.2)
ALT: 9 U/L (ref 0–35)
AST: 18 U/L (ref 0–37)
Alkaline Phosphatase: 62 U/L (ref 39–117)
BILIRUBIN TOTAL: 0.7 mg/dL (ref 0.2–1.2)
BUN: 13 mg/dL (ref 6–23)
CALCIUM: 9.9 mg/dL (ref 8.4–10.5)
CO2: 31 meq/L (ref 19–32)
CREATININE: 0.78 mg/dL (ref 0.40–1.20)
Chloride: 102 mEq/L (ref 96–112)
GFR: 80.99 mL/min (ref >60.00)
Glucose, Bld: 92 mg/dL (ref 70–99)
Potassium: 3.8 mEq/L (ref 3.5–5.1)
Sodium: 141 mEq/L (ref 135–145)
TOTAL PROTEIN: 7.4 g/dL (ref 6.0–8.3)

## 2016-01-20 LAB — LIPID PANEL
CHOLESTEROL: 236 mg/dL — AB (ref 0–200)
HDL: 88.9 mg/dL (ref >39.00)
LDL Cholesterol: 138 mg/dL — ABNORMAL HIGH (ref 0–99)
NonHDL: 147.23
TRIGLYCERIDES: 48 mg/dL (ref 0.0–149.0)
Total CHOL/HDL Ratio: 3
VLDL: 9.6 mg/dL (ref 0.0–40.0)

## 2016-01-20 LAB — TSH: TSH: 2.42 u[IU]/mL (ref 0.35–4.50)

## 2016-01-20 NOTE — Patient Instructions (Signed)
Preventive Care for Adults, Female A healthy lifestyle and preventive care can promote health and wellness. Preventive health guidelines for women include the following key practices.  A routine yearly physical is a good way to check with your health care provider about your health and preventive screening. It is a chance to share any concerns and updates on your health and to receive a thorough exam.  Visit your dentist for a routine exam and preventive care every 6 months. Brush your teeth twice a day and floss once a day. Good oral hygiene prevents tooth decay and gum disease.  The frequency of eye exams is based on your age, health, family medical history, use of contact lenses, and other factors. Follow your health care provider's recommendations for frequency of eye exams.  Eat a healthy diet. Foods like vegetables, fruits, whole grains, low-fat dairy products, and lean protein foods contain the nutrients you need without too many calories. Decrease your intake of foods high in solid fats, added sugars, and salt. Eat the right amount of calories for you.Get information about a proper diet from your health care provider, if necessary.  Regular physical exercise is one of the most important things you can do for your health. Most adults should get at least 150 minutes of moderate-intensity exercise (any activity that increases your heart rate and causes you to sweat) each week. In addition, most adults need muscle-strengthening exercises on 2 or more days a week.  Maintain a healthy weight. The body mass index (BMI) is a screening tool to identify possible weight problems. It provides an estimate of body fat based on height and weight. Your health care provider can find your BMI and can help you achieve or maintain a healthy weight.For adults 20 years and older:  A BMI below 18.5 is considered underweight.  A BMI of 18.5 to 24.9 is normal.  A BMI of 25 to 29.9 is considered overweight.  A  BMI of 30 and above is considered obese.  Maintain normal blood lipids and cholesterol levels by exercising and minimizing your intake of saturated fat. Eat a balanced diet with plenty of fruit and vegetables. Blood tests for lipids and cholesterol should begin at age 45 and be repeated every 5 years. If your lipid or cholesterol levels are high, you are over 50, or you are at high risk for heart disease, you may need your cholesterol levels checked more frequently.Ongoing high lipid and cholesterol levels should be treated with medicines if diet and exercise are not working.  If you smoke, find out from your health care provider how to quit. If you do not use tobacco, do not start.  Lung cancer screening is recommended for adults aged 45-80 years who are at high risk for developing lung cancer because of a history of smoking. A yearly low-dose CT scan of the lungs is recommended for people who have at least a 30-pack-year history of smoking and are a current smoker or have quit within the past 15 years. A pack year of smoking is smoking an average of 1 pack of cigarettes a day for 1 year (for example: 1 pack a day for 30 years or 2 packs a day for 15 years). Yearly screening should continue until the smoker has stopped smoking for at least 15 years. Yearly screening should be stopped for people who develop a health problem that would prevent them from having lung cancer treatment.  If you are pregnant, do not drink alcohol. If you are  breastfeeding, be very cautious about drinking alcohol. If you are not pregnant and choose to drink alcohol, do not have more than 1 drink per day. One drink is considered to be 12 ounces (355 mL) of beer, 5 ounces (148 mL) of wine, or 1.5 ounces (44 mL) of liquor.  Avoid use of street drugs. Do not share needles with anyone. Ask for help if you need support or instructions about stopping the use of drugs.  High blood pressure causes heart disease and increases the risk  of stroke. Your blood pressure should be checked at least every 1 to 2 years. Ongoing high blood pressure should be treated with medicines if weight loss and exercise do not work.  If you are 55-79 years old, ask your health care provider if you should take aspirin to prevent strokes.  Diabetes screening is done by taking a blood sample to check your blood glucose level after you have not eaten for a certain period of time (fasting). If you are not overweight and you do not have risk factors for diabetes, you should be screened once every 3 years starting at age 45. If you are overweight or obese and you are 40-70 years of age, you should be screened for diabetes every year as part of your cardiovascular risk assessment.  Breast cancer screening is essential preventive care for women. You should practice "breast self-awareness." This means understanding the normal appearance and feel of your breasts and may include breast self-examination. Any changes detected, no matter how small, should be reported to a health care provider. Women in their 20s and 30s should have a clinical breast exam (CBE) by a health care provider as part of a regular health exam every 1 to 3 years. After age 40, women should have a CBE every year. Starting at age 40, women should consider having a mammogram (breast X-ray test) every year. Women who have a family history of breast cancer should talk to their health care provider about genetic screening. Women at a high risk of breast cancer should talk to their health care providers about having an MRI and a mammogram every year.  Breast cancer gene (BRCA)-related cancer risk assessment is recommended for women who have family members with BRCA-related cancers. BRCA-related cancers include breast, ovarian, tubal, and peritoneal cancers. Having family members with these cancers may be associated with an increased risk for harmful changes (mutations) in the breast cancer genes BRCA1 and  BRCA2. Results of the assessment will determine the need for genetic counseling and BRCA1 and BRCA2 testing.  Your health care provider may recommend that you be screened regularly for cancer of the pelvic organs (ovaries, uterus, and vagina). This screening involves a pelvic examination, including checking for microscopic changes to the surface of your cervix (Pap test). You may be encouraged to have this screening done every 3 years, beginning at age 21.  For women ages 30-65, health care providers may recommend pelvic exams and Pap testing every 3 years, or they may recommend the Pap and pelvic exam, combined with testing for human papilloma virus (HPV), every 5 years. Some types of HPV increase your risk of cervical cancer. Testing for HPV may also be done on women of any age with unclear Pap test results.  Other health care providers may not recommend any screening for nonpregnant women who are considered low risk for pelvic cancer and who do not have symptoms. Ask your health care provider if a screening pelvic exam is right for   you.  If you have had past treatment for cervical cancer or a condition that could lead to cancer, you need Pap tests and screening for cancer for at least 20 years after your treatment. If Pap tests have been discontinued, your risk factors (such as having a new sexual partner) need to be reassessed to determine if screening should resume. Some women have medical problems that increase the chance of getting cervical cancer. In these cases, your health care provider may recommend more frequent screening and Pap tests.  Colorectal cancer can be detected and often prevented. Most routine colorectal cancer screening begins at the age of 50 years and continues through age 75 years. However, your health care provider may recommend screening at an earlier age if you have risk factors for colon cancer. On a yearly basis, your health care provider may provide home test kits to check  for hidden blood in the stool. Use of a small camera at the end of a tube, to directly examine the colon (sigmoidoscopy or colonoscopy), can detect the earliest forms of colorectal cancer. Talk to your health care provider about this at age 50, when routine screening begins. Direct exam of the colon should be repeated every 5-10 years through age 75 years, unless early forms of precancerous polyps or small growths are found.  People who are at an increased risk for hepatitis B should be screened for this virus. You are considered at high risk for hepatitis B if:  You were born in a country where hepatitis B occurs often. Talk with your health care provider about which countries are considered high risk.  Your parents were born in a high-risk country and you have not received a shot to protect against hepatitis B (hepatitis B vaccine).  You have HIV or AIDS.  You use needles to inject street drugs.  You live with, or have sex with, someone who has hepatitis B.  You get hemodialysis treatment.  You take certain medicines for conditions like cancer, organ transplantation, and autoimmune conditions.  Hepatitis C blood testing is recommended for all people born from 1945 through 1965 and any individual with known risks for hepatitis C.  Practice safe sex. Use condoms and avoid high-risk sexual practices to reduce the spread of sexually transmitted infections (STIs). STIs include gonorrhea, chlamydia, syphilis, trichomonas, herpes, HPV, and human immunodeficiency virus (HIV). Herpes, HIV, and HPV are viral illnesses that have no cure. They can result in disability, cancer, and death.  You should be screened for sexually transmitted illnesses (STIs) including gonorrhea and chlamydia if:  You are sexually active and are younger than 24 years.  You are older than 24 years and your health care provider tells you that you are at risk for this type of infection.  Your sexual activity has changed  since you were last screened and you are at an increased risk for chlamydia or gonorrhea. Ask your health care provider if you are at risk.  If you are at risk of being infected with HIV, it is recommended that you take a prescription medicine daily to prevent HIV infection. This is called preexposure prophylaxis (PrEP). You are considered at risk if:  You are sexually active and do not regularly use condoms or know the HIV status of your partner(s).  You take drugs by injection.  You are sexually active with a partner who has HIV.  Talk with your health care provider about whether you are at high risk of being infected with HIV. If   you choose to begin PrEP, you should first be tested for HIV. You should then be tested every 3 months for as long as you are taking PrEP.  Osteoporosis is a disease in which the bones lose minerals and strength with aging. This can result in serious bone fractures or breaks. The risk of osteoporosis can be identified using a bone density scan. Women ages 67 years and over and women at risk for fractures or osteoporosis should discuss screening with their health care providers. Ask your health care provider whether you should take a calcium supplement or vitamin D to reduce the rate of osteoporosis.  Menopause can be associated with physical symptoms and risks. Hormone replacement therapy is available to decrease symptoms and risks. You should talk to your health care provider about whether hormone replacement therapy is right for you.  Use sunscreen. Apply sunscreen liberally and repeatedly throughout the day. You should seek shade when your shadow is shorter than you. Protect yourself by wearing long sleeves, pants, a wide-brimmed hat, and sunglasses year round, whenever you are outdoors.  Once a month, do a whole body skin exam, using a mirror to look at the skin on your back. Tell your health care provider of new moles, moles that have irregular borders, moles that  are larger than a pencil eraser, or moles that have changed in shape or color.  Stay current with required vaccines (immunizations).  Influenza vaccine. All adults should be immunized every year.  Tetanus, diphtheria, and acellular pertussis (Td, Tdap) vaccine. Pregnant women should receive 1 dose of Tdap vaccine during each pregnancy. The dose should be obtained regardless of the length of time since the last dose. Immunization is preferred during the 27th-36th week of gestation. An adult who has not previously received Tdap or who does not know her vaccine status should receive 1 dose of Tdap. This initial dose should be followed by tetanus and diphtheria toxoids (Td) booster doses every 10 years. Adults with an unknown or incomplete history of completing a 3-dose immunization series with Td-containing vaccines should begin or complete a primary immunization series including a Tdap dose. Adults should receive a Td booster every 10 years.  Varicella vaccine. An adult without evidence of immunity to varicella should receive 2 doses or a second dose if she has previously received 1 dose. Pregnant females who do not have evidence of immunity should receive the first dose after pregnancy. This first dose should be obtained before leaving the health care facility. The second dose should be obtained 4-8 weeks after the first dose.  Human papillomavirus (HPV) vaccine. Females aged 13-26 years who have not received the vaccine previously should obtain the 3-dose series. The vaccine is not recommended for use in pregnant females. However, pregnancy testing is not needed before receiving a dose. If a female is found to be pregnant after receiving a dose, no treatment is needed. In that case, the remaining doses should be delayed until after the pregnancy. Immunization is recommended for any person with an immunocompromised condition through the age of 61 years if she did not get any or all doses earlier. During the  3-dose series, the second dose should be obtained 4-8 weeks after the first dose. The third dose should be obtained 24 weeks after the first dose and 16 weeks after the second dose.  Zoster vaccine. One dose is recommended for adults aged 30 years or older unless certain conditions are present.  Measles, mumps, and rubella (MMR) vaccine. Adults born  before 1957 generally are considered immune to measles and mumps. Adults born in 1957 or later should have 1 or more doses of MMR vaccine unless there is a contraindication to the vaccine or there is laboratory evidence of immunity to each of the three diseases. A routine second dose of MMR vaccine should be obtained at least 28 days after the first dose for students attending postsecondary schools, health care workers, or international travelers. People who received inactivated measles vaccine or an unknown type of measles vaccine during 1963-1967 should receive 2 doses of MMR vaccine. People who received inactivated mumps vaccine or an unknown type of mumps vaccine before 1979 and are at high risk for mumps infection should consider immunization with 2 doses of MMR vaccine. For females of childbearing age, rubella immunity should be determined. If there is no evidence of immunity, females who are not pregnant should be vaccinated. If there is no evidence of immunity, females who are pregnant should delay immunization until after pregnancy. Unvaccinated health care workers born before 1957 who lack laboratory evidence of measles, mumps, or rubella immunity or laboratory confirmation of disease should consider measles and mumps immunization with 2 doses of MMR vaccine or rubella immunization with 1 dose of MMR vaccine.  Pneumococcal 13-valent conjugate (PCV13) vaccine. When indicated, a person who is uncertain of his immunization history and has no record of immunization should receive the PCV13 vaccine. All adults 65 years of age and older should receive this  vaccine. An adult aged 19 years or older who has certain medical conditions and has not been previously immunized should receive 1 dose of PCV13 vaccine. This PCV13 should be followed with a dose of pneumococcal polysaccharide (PPSV23) vaccine. Adults who are at high risk for pneumococcal disease should obtain the PPSV23 vaccine at least 8 weeks after the dose of PCV13 vaccine. Adults older than 56 years of age who have normal immune system function should obtain the PPSV23 vaccine dose at least 1 year after the dose of PCV13 vaccine.  Pneumococcal polysaccharide (PPSV23) vaccine. When PCV13 is also indicated, PCV13 should be obtained first. All adults aged 65 years and older should be immunized. An adult younger than age 65 years who has certain medical conditions should be immunized. Any person who resides in a nursing home or long-term care facility should be immunized. An adult smoker should be immunized. People with an immunocompromised condition and certain other conditions should receive both PCV13 and PPSV23 vaccines. People with human immunodeficiency virus (HIV) infection should be immunized as soon as possible after diagnosis. Immunization during chemotherapy or radiation therapy should be avoided. Routine use of PPSV23 vaccine is not recommended for American Indians, [place] Natives, or people younger than 65 years unless there are medical conditions that require PPSV23 vaccine. When indicated, people who have unknown immunization and have no record of immunization should receive PPSV23 vaccine. One-time revaccination 5 years after the first dose of PPSV23 is recommended for people aged 19-64 years who have chronic kidney failure, nephrotic syndrome, asplenia, or immunocompromised conditions. People who received 1-2 doses of PPSV23 before age 65 years should receive another dose of PPSV23 vaccine at age 65 years or later if at least 5 years have passed since the previous dose. Doses of PPSV23 are not  needed for people immunized with PPSV23 at or after age 65 years.  Meningococcal vaccine. Adults with asplenia or persistent complement component deficiencies should receive 2 doses of quadrivalent meningococcal conjugate (MenACWY-D) vaccine. The doses should be obtained   at least 2 months apart. Microbiologists working with certain meningococcal bacteria, [place] recruits, people at risk during an outbreak, and people who travel to or live in countries with a high rate of meningitis should be immunized. A first-year college student up through age 34 years who is living in a residence hall should receive a dose if she did not receive a dose on or after her 16th birthday. Adults who have certain high-risk conditions should receive one or more doses of vaccine.  Hepatitis A vaccine. Adults who wish to be protected from this disease, have certain high-risk conditions, work with hepatitis A-infected animals, work in hepatitis A research labs, or travel to or work in countries with a high rate of hepatitis A should be immunized. Adults who were previously unvaccinated and who anticipate close contact with an international adoptee during the first 60 days after arrival in the [place] from a country with a high rate of hepatitis A should be immunized.  Hepatitis B vaccine. Adults who wish to be protected from this disease, have certain high-risk conditions, may be exposed to blood or other infectious body fluids, are household contacts or sex partners of hepatitis B positive people, are clients or workers in certain care facilities, or travel to or work in countries with a high rate of hepatitis B should be immunized.  Haemophilus influenzae type b (Hib) vaccine. A previously unvaccinated person with asplenia or sickle cell disease or having a scheduled splenectomy should receive 1 dose of Hib vaccine. Regardless of previous immunization, a recipient of a hematopoietic stem cell transplant should receive a  3-dose series 6-12 months after her successful transplant. Hib vaccine is not recommended for adults with HIV infection. Preventive Services / Frequency Ages 35 to 4 years  Blood pressure check.** / Every 3-5 years.  Lipid and cholesterol check.** / Every 5 years beginning at age 60.  Clinical breast exam.** / Every 3 years for women in their 71s and 10s.  BRCA-related cancer risk assessment.** / For women who have family members with a BRCA-related cancer (breast, ovarian, tubal, or peritoneal cancers).  Pap test.** / Every 2 years from ages 76 through 26. Every 3 years starting at age 61 through age 76 or 93 with a history of 3 consecutive normal Pap tests.  HPV screening.** / Every 3 years from ages 37 through ages 60 to 51 with a history of 3 consecutive normal Pap tests.  Hepatitis C blood test.** / For any individual with known risks for hepatitis C.  Skin self-exam. / Monthly.  Influenza vaccine. / Every year.  Tetanus, diphtheria, and acellular pertussis (Tdap, Td) vaccine.** / Consult your health care provider. Pregnant women should receive 1 dose of Tdap vaccine during each pregnancy. 1 dose of Td every 10 years.  Varicella vaccine.** / Consult your health care provider. Pregnant females who do not have evidence of immunity should receive the first dose after pregnancy.  HPV vaccine. / 3 doses over 6 months, if 93 and younger. The vaccine is not recommended for use in pregnant females. However, pregnancy testing is not needed before receiving a dose.  Measles, mumps, rubella (MMR) vaccine.** / You need at least 1 dose of MMR if you were born in 1957 or later. You may also need a 2nd dose. For females of childbearing age, rubella immunity should be determined. If there is no evidence of immunity, females who are not pregnant should be vaccinated. If there is no evidence of immunity, females who are  pregnant should delay immunization until after pregnancy.  Pneumococcal  13-valent conjugate (PCV13) vaccine.** / Consult your health care provider.  Pneumococcal polysaccharide (PPSV23) vaccine.** / 1 to 2 doses if you smoke cigarettes or if you have certain conditions.  Meningococcal vaccine.** / 1 dose if you are age 68 to 8 years and a Market researcher living in a residence hall, or have one of several medical conditions, you need to get vaccinated against meningococcal disease. You may also need additional booster doses.  Hepatitis A vaccine.** / Consult your health care provider.  Hepatitis B vaccine.** / Consult your health care provider.  Haemophilus influenzae type b (Hib) vaccine.** / Consult your health care provider. Ages 7 to 53 years  Blood pressure check.** / Every year.  Lipid and cholesterol check.** / Every 5 years beginning at age 25 years.  Lung cancer screening. / Every year if you are aged 11-80 years and have a 30-pack-year history of smoking and currently smoke or have quit within the past 15 years. Yearly screening is stopped once you have quit smoking for at least 15 years or develop a health problem that would prevent you from having lung cancer treatment.  Clinical breast exam.** / Every year after age 48 years.  BRCA-related cancer risk assessment.** / For women who have family members with a BRCA-related cancer (breast, ovarian, tubal, or peritoneal cancers).  Mammogram.** / Every year beginning at age 41 years and continuing for as long as you are in good health. Consult with your health care provider.  Pap test.** / Every 3 years starting at age 65 years through age 37 or 70 years with a history of 3 consecutive normal Pap tests.  HPV screening.** / Every 3 years from ages 72 years through ages 60 to 40 years with a history of 3 consecutive normal Pap tests.  Fecal occult blood test (FOBT) of stool. / Every year beginning at age 21 years and continuing until age 5 years. You may not need to do this test if you get  a colonoscopy every 10 years.  Flexible sigmoidoscopy or colonoscopy.** / Every 5 years for a flexible sigmoidoscopy or every 10 years for a colonoscopy beginning at age 35 years and continuing until age 48 years.  Hepatitis C blood test.** / For all people born from [birth year] through 1966 and any individual with known risks for hepatitis C.  Skin self-exam. / Monthly.  Influenza vaccine. / Every year.  Tetanus, diphtheria, and acellular pertussis (Tdap/Td) vaccine.** / Consult your health care provider. Pregnant women should receive 1 dose of Tdap vaccine during each pregnancy. 1 dose of Td every 10 years.  Varicella vaccine.** / Consult your health care provider. Pregnant females who do not have evidence of immunity should receive the first dose after pregnancy.  Zoster vaccine.** / 1 dose for adults aged 30 years or older.  Measles, mumps, rubella (MMR) vaccine.** / You need at least 1 dose of MMR if you were born in 1957 or later. You may also need a second dose. For females of childbearing age, rubella immunity should be determined. If there is no evidence of immunity, females who are not pregnant should be vaccinated. If there is no evidence of immunity, females who are pregnant should delay immunization until after pregnancy.  Pneumococcal 13-valent conjugate (PCV13) vaccine.** / Consult your health care provider.  Pneumococcal polysaccharide (PPSV23) vaccine.** / 1 to 2 doses if you smoke cigarettes or if you have certain conditions.  Meningococcal vaccine.** /  Consult your health care provider.  Hepatitis A vaccine.** / Consult your health care provider.  Hepatitis B vaccine.** / Consult your health care provider.  Haemophilus influenzae type b (Hib) vaccine.** / Consult your health care provider. Ages 64 years and over  Blood pressure check.** / Every year.  Lipid and cholesterol check.** / Every 5 years beginning at age 23 years.  Lung cancer screening. / Every year if you  are aged 16-80 years and have a 30-pack-year history of smoking and currently smoke or have quit within the past 15 years. Yearly screening is stopped once you have quit smoking for at least 15 years or develop a health problem that would prevent you from having lung cancer treatment.  Clinical breast exam.** / Every year after age 74 years.  BRCA-related cancer risk assessment.** / For women who have family members with a BRCA-related cancer (breast, ovarian, tubal, or peritoneal cancers).  Mammogram.** / Every year beginning at age 44 years and continuing for as long as you are in good health. Consult with your health care provider.  Pap test.** / Every 3 years starting at age 58 years through age 22 or 39 years with 3 consecutive normal Pap tests. Testing can be stopped between 65 and 70 years with 3 consecutive normal Pap tests and no abnormal Pap or HPV tests in the past 10 years.  HPV screening.** / Every 3 years from ages 64 years through ages 70 or 61 years with a history of 3 consecutive normal Pap tests. Testing can be stopped between 65 and 70 years with 3 consecutive normal Pap tests and no abnormal Pap or HPV tests in the past 10 years.  Fecal occult blood test (FOBT) of stool. / Every year beginning at age 40 years and continuing until age 27 years. You may not need to do this test if you get a colonoscopy every 10 years.  Flexible sigmoidoscopy or colonoscopy.** / Every 5 years for a flexible sigmoidoscopy or every 10 years for a colonoscopy beginning at age 7 years and continuing until age 32 years.  Hepatitis C blood test.** / For all people born from [birth year] through 1966 and any individual with known risks for hepatitis C.  Osteoporosis screening.** / A one-time screening for women ages 30 years and over and women at risk for fractures or osteoporosis.  Skin self-exam. / Monthly.  Influenza vaccine. / Every year.  Tetanus, diphtheria, and acellular pertussis (Tdap/Td)  vaccine.** / 1 dose of Td every 10 years.  Varicella vaccine.** / Consult your health care provider.  Zoster vaccine.** / 1 dose for adults aged 35 years or older.  Pneumococcal 13-valent conjugate (PCV13) vaccine.** / Consult your health care provider.  Pneumococcal polysaccharide (PPSV23) vaccine.** / 1 dose for all adults aged 46 years and older.  Meningococcal vaccine.** / Consult your health care provider.  Hepatitis A vaccine.** / Consult your health care provider.  Hepatitis B vaccine.** / Consult your health care provider.  Haemophilus influenzae type b (Hib) vaccine.** / Consult your health care provider. ** Family history and personal history of risk and conditions may change your health care provider's recommendations.   This information is not intended to replace advice given to you by your health care provider. Make sure you discuss any questions you have with your health care provider.   Document Released: 05/30/2001 Document Revised: 04/24/2014 Document Reviewed: 08/29/2010 Elsevier Interactive Patient Education [employer].

## 2016-01-20 NOTE — Progress Notes (Signed)
Subjective:     Christy Morales is a 55 y.o. female and is here for a comprehensive physical exam. The patient reports no problems.  Social History   Social History  . Marital status: Married    Spouse name: Dr. Marcelyn BruinsKeith Joines  . Number of children: N/A  . 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 Smoking status: Never Smoker  . Smokeless tobacco: Never Used  . Alcohol use Yes     Comment: social  . Drug use: No  . Sexual activity: Yes    Partners: Male   Other Topics Concern  . Not on file   Social History Narrative  . No narrative on file   Health Maintenance  Topic Date Due  . INFLUENZA VACCINE  01/19/2017 (Originally 11/16/2015)  . MAMMOGRAM  01/19/2017 (Originally 09/16/2015)  . Hepatitis C Screening  01/19/2017 (Originally Mar 28, 1961)  . PAP SMEAR  09/15/2016  . TETANUS/TDAP  04/29/2018  . COLONOSCOPY  06/05/2024  . HIV Screening  Completed    The following portions of the patient's history were reviewed and updated as appropriate:  She  has a past medical history of DJD (degenerative joint disease); Family history of breast cancer; History of shingles; Osteoporosis; and Paroxysmal supraventricular tachycardia (HCC). She  does not have any pertinent problems on file. She  has a past surgical history that includes tubal pregnancy with D & C and right knee arthroscopy for meniscus tear. Her family history includes Breast cancer in her mother; Breast cancer (age of onset: 6856) in her sister; Heart disease in her father and mother; High Cholesterol in her father and mother; Hyperparathyroidism in her sister; Osteoporosis in her maternal grandmother and sister. She  reports that she has never smoked. She has never used smokeless tobacco. She reports that she drinks alcohol. She reports that she does not use drugs. She has a current medication list which includes the following prescription(s): calcium  citrate-vitamin d and multiple vitamins-minerals. Current Outpatient Prescriptions on File Prior to Visit  Medication Sig Dispense Refill  . calcium citrate-vitamin D (CITRACAL+D) 315-200 MG-UNIT per tablet Take 2 tablets by mouth 2 (two) times daily.    . Multiple Vitamins-Minerals (MULTIVITAMIN & MINERAL PO) Take one tablet 2-3 times per week     No current facility-administered medications on file prior to visit.    She is allergic to penicillins and sulfamethoxazole-trimethoprim..  Review of Systems Review of Systems  Constitutional: Negative for activity change, appetite change and fatigue.  HENT: Negative for hearing loss, congestion, tinnitus and ear discharge.  dentist [id number] Eyes: Negative for visual disturbance (see optho q1y -- vision corrected to 20/20 with glasses).  Respiratory: Negative for cough, chest tightness and shortness of breath.   Cardiovascular: Negative for chest pain, palpitations and leg swelling.  Gastrointestinal: Negative for abdominal pain, diarrhea, constipation and abdominal distention.  Genitourinary: Negative for urgency, frequency, decreased urine volume and difficulty urinating.  Musculoskeletal: Negative for back pain, arthralgias and gait problem.  Skin: Negative for color change, pallor and rash.  Neurological: Negative for dizziness, light-headedness, numbness and headaches.  Hematological: Negative for adenopathy. Does not bruise/bleed easily.  Psychiatric/Behavioral: Negative for suicidal ideas, confusion, sleep disturbance, self-injury, dysphoric mood, decreased concentration and agitation.       Objective:    BP 121/76 (BP Location: Left Arm, Patient Position: Sitting, Cuff Size: Normal)   Pulse (!) 51   Temp 97.9  F (36.6 C) (Oral)   Resp 16   Ht 5\' 5"  (1.651 m)   Wt 155 lb 12.8 oz (70.7 kg)   SpO2 100%   BMI 25.93 kg/m  General appearance: alert, cooperative, appears stated age and no distress Head: Normocephalic, without obvious  abnormality, atraumatic Eyes: conjunctivae/corneas clear. PERRL, EOM's intact. Fundi benign. Ears: normal TM's and external ear canals both ears Nose: Nares normal. Septum midline. Mucosa normal. No drainage or sinus tenderness. Throat: lips, mucosa, and tongue normal; teeth and gums normal Neck: no adenopathy, no carotid bruit, no JVD, supple, symmetrical, trachea midline and thyroid not enlarged, symmetric, no tenderness/mass/nodules Back: symmetric, no curvature. ROM normal. No CVA tenderness. Lungs: clear to auscultation bilaterally Breasts: gyn Heart: regular rate and rhythm, S1, S2 normal, no murmur, click, rub or gallop Abdomen: soft, non-tender; bowel sounds normal; no masses,  no organomegaly Pelvic: deferred--gyn Extremities: extremities normal, atraumatic, no cyanosis or edema Pulses: 2+ and symmetric Skin: Skin color, texture, turgor normal. No rashes or lesions Lymph nodes: Cervical, supraclavicular, and axillary nodes normal. Neurologic: Alert and oriented X 3, normal strength and tone. Normal symmetric reflexes. Normal coordination and gait    Assessment:    Healthy female exam.      Plan:    ghm utd Check labs See After Visit Summary for Counseling Recommendations    1. Family history of breast cancer   - Ambulatory referral to Genetics  2. Preventative health care see above - Lipid panel - Comprehensive metabolic panel - CBC with Differential/Platelet - POCT urinalysis dipstick - TSH

## 2016-01-20 NOTE — Progress Notes (Signed)
Pre visit review using our clinic review tool, if applicable. No additional management support is needed unless otherwise documented below in the visit note. 

## 2016-01-21 ENCOUNTER — Encounter: Payer: Self-pay | Admitting: Family Medicine

## 2016-01-26 ENCOUNTER — Encounter: Payer: Self-pay | Admitting: Genetic Counselor

## 2016-01-26 ENCOUNTER — Telehealth: Payer: Self-pay | Admitting: Genetic Counselor

## 2016-01-26 NOTE — Telephone Encounter (Signed)
Appt scheduled w/[name] for 11/16 at 2pm. Pt aware to arrive 15-30 minutes early. Demographics verified. Letter mailed to the pt.

## 2016-03-02 ENCOUNTER — Ambulatory Visit (HOSPITAL_BASED_OUTPATIENT_CLINIC_OR_DEPARTMENT_OTHER): Payer: Federal, State, Local not specified - PPO | Admitting: Genetic Counselor

## 2016-03-02 ENCOUNTER — Other Ambulatory Visit: Payer: Federal, State, Local not specified - PPO

## 2016-03-02 DIAGNOSIS — Z800 Family history of malignant neoplasm of digestive organs: Secondary | ICD-10-CM | POA: Diagnosis not present

## 2016-03-02 DIAGNOSIS — H10413 Chronic giant papillary conjunctivitis, bilateral: Secondary | ICD-10-CM | POA: Diagnosis not present

## 2016-03-02 DIAGNOSIS — Z315 Encounter for genetic counseling: Secondary | ICD-10-CM | POA: Diagnosis not present

## 2016-03-02 DIAGNOSIS — H47323 Drusen of optic disc, bilateral: Secondary | ICD-10-CM | POA: Diagnosis not present

## 2016-03-02 DIAGNOSIS — H4423 Degenerative myopia, bilateral: Secondary | ICD-10-CM | POA: Diagnosis not present

## 2016-03-02 DIAGNOSIS — Z803 Family history of malignant neoplasm of breast: Secondary | ICD-10-CM | POA: Diagnosis not present

## 2016-03-03 ENCOUNTER — Encounter: Payer: Self-pay | Admitting: Genetic Counselor

## 2016-03-03 DIAGNOSIS — Z803 Family history of malignant neoplasm of breast: Secondary | ICD-10-CM | POA: Insufficient documentation

## 2016-03-03 NOTE — Progress Notes (Signed)
REFERRING PROVIDER: [name], DO [address] [place], [place] [address]  PRIMARY PROVIDER:  [name], DO  PRIMARY REASON FOR VISIT:  1. Family history of breast cancer in first degree relative   2. Family history of stomach cancer      HISTORY OF PRESENT ILLNESS:   Christy Morales, a 55 y.o. female, was seen for a [place] cancer genetics consultation at the request of Dr. Carollee Herter due to a family history of breast and other cancers.  Christy Morales presents to clinic today to discuss the possibility of a hereditary predisposition to cancer, genetic testing, and to further clarify her future cancer risks, as well as potential cancer risks for family members.    Christy Morales is a 55 y.o. female with no personal history of cancer.     HORMONAL RISK FACTORS:  Menarche was at age 35.  First live birth at age 77.  OCP use for approximately 8 years.  Ovaries intact: yes.  Hysterectomy: no.  Menopausal status: postmenopausal.  HRT use: 0 years. Colonoscopy: yes; first colonoscopy in 03/2015 - found 2 small polyps and per her report, she is on a 5- year schedule. Mammogram within the last year: yes, she will go on Tuesday for her next mammogram; reports high breast density. Number of breast biopsies: 0. Up to date with pelvic exams:  yes. 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 DJD (degenerative joint disease)   . Family history of breast cancer   . History of shingles   . Osteoporosis    lower spine  . Paroxysmal supraventricular tachycardia (HCC)     Past Surgical History:  Procedure Laterality Date  . right knee arthroscopy for meniscus tear     Dr. Onnie Graham  . tubal pregnancy with D & C      Social History   Social History  .  Marital status: Married    Spouse name: Dr. Danton Sewer  . Number of children: N/A  . 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 Smoking status: Never Smoker  . Smokeless tobacco: Never Used  . Alcohol use 1.2 oz/week    2 Glasses of wine per week  . Drug use: No  . Sexual activity: Yes    Partners: Male   Other Topics Concern  . None   Social History Narrative  . None     FAMILY HISTORY:  We obtained a detailed, 4-generation family history.  Significant diagnoses are listed below: Family History  Problem Relation Age of Onset  . High Cholesterol Father   . Heart disease Father   . Esophageal cancer Father 80    smoker and heavy drinker  . Stomach cancer Father 61    smoker and heavy drinker  . Breast cancer Mother 31  . High Cholesterol Mother   . Heart disease Mother   . Heart attack Mother     d. 43y  . Breast cancer Sister 71    d. 50y; took fertility hormones  . Other Sister     hx cervical ablation  . Osteoporosis Maternal Grandmother   . Osteoporosis Sister   . Hyperparathyroidism Sister  adenoma  . Cancer Maternal Grandfather     oral cancer dx 88s; smoker and SL tob user  . Pneumonia Paternal Grandmother     d. 25s  . Aneurysm Paternal Grandfather     brain; d. 78s    Christy Morales has one son and one daughter, ages 110 and 24.  She has three full sisters and one full brother.  One sister was diagnosed with breast cancer at 4 and she died of recurrence at 52.  Christy Morales other siblings are currently ages 13-60 and have not had cancer.  She reports that her other two sisters have had genetic counseling, but not genetic testing, as the genetic counselor told them that the family history of breast cancer was postmenopausal and was likely not demonstrative of a hereditary cancer syndrome.    Christy Morales. [place] mother was diagnosed with breast cancer at 67.  She passed away at 65 from  complications following a heart attack.  Christy Morales mother was an only child.  Christy Morales maternal grandmother passed away in her 13s and had osteoporosis.  Christy Morales grandfather passed away in his 13s from failure to thrive.  He was diagnosed with an oral cancer in his 63s, and had a history of smoking and smokeless tobacco use.  Christy Morales has limited information for her maternal great aunts/uncles and great grandparents.  Christy Morales father was diagnosed with esophageal and stomach cancers around the age of 42 and passed away shortly after.  He had a history of smoking and heavy alcohol use.  He had one full brother who died of suicide in his late 7s.  This brother had one daughter, and she is not currently in touch with the family.  Christy Morales paternal grandmother died of pneumonia in her 68s.  Her grandfather died of a brain aneurysm in his 84s.  Christy Morales has limited information for her paternal great aunts/uncles and great grandparents.  Christy Morales reports no known family history of genetic testing for hereditary cancer risks.  Patient's maternal ancestors are of [place] and [place] descent, and paternal ancestors are of [place] descent. There is no reported Ashkenazi Jewish ancestry. There is no known consanguinity.  GENETIC COUNSELING ASSESSMENT: Christy Morales is a 55 y.o. female with a family history of breast cancer.  We, therefore, discussed and recommended the following at today's visit.   DISCUSSION: We reviewed the characteristics, features and inheritance patterns of hereditary cancer syndromes. We discussed that only approximately 5-10% of cancer is caused by a gene mutation.  We discussed that, for many of these hereditary cancers, we can pick up on certain "red flags" in someone's personal and/or family history of cancer.  These red flags include: early age of cancer diagnosis, diagnosis of multiple primary cancers in one individual, multiple family members who have been diagnosed  with the same (or related) cancer and in multiple generations of a family, and presence of rare cancers in a family, among others.  We reviewed that Christy Morales's family history seems to be reassuring, in that the breast cancer history was diagnosed at older ages (over age 19).  However, we also discussed that Christy Morales's family is much smaller in size.  We also discussed genetic testing, including the appropriate family members to test, the process of testing, insurance coverage and turn-around-time for results. We discussed the implications of a negative, positive and/or variant of uncertain significant result.   We discussed with Christy Morales that the family history is not  highly consistent with a familial hereditary cancer syndrome, and we feel she is at low risk to harbor a gene mutation associated with such a condition. Thus, Christy Morales does not meet her insurance criteria for genetic testing.  However, she is still interested in genetic testing, so we discussed self-pay options for genetic testing.  She would like to proceed with $250 self-pay genetic testing through [facility].  Thus, we recommended she pursue genetic testing via the 42-gene [facility] Common Hereditary Cancers Panel (Breast, Gyn, GI) through [facility].  The 42-gene [facility] Common Hereditary Cancers Panel (Breast, Gyn, GI) performed by [facility] [place]) includes sequencing and/or deletion/duplication analysis for the following genes: APC, ATM, AXIN2, BARD1, BMPR1A, BRCA1, BRCA2, BRIP1, CDH1, CDKN2A, CHEK2, DICER1, EPCAM, GREM1, KIT, MEN1, MLH1, MSH2, MSH6, MUTYH, NBN, NF1, PALB2, PDGFRA, PMS2, POLD1, POLE, PTEN, RAD50, RAD51C, RAD51D, SDHA, SDHB, SDHC, SDHD, SMAD4, SMARCA4, STK11, TP53, TSC1, TSC2, and VHL.  Based on the patient's personal and family history, statistical models Baker Janus model and IBIS/Tyrer-Cuzick)  and literature data were used to estimate her risk of developing breast cancer. These  estimate her lifetime risk of developing breast cancer to be approximately 20.2% to 25.6%. This estimation does not take into account any genetic testing results.  The patient's lifetime breast cancer risk is a preliminary estimate based on available information using one of several models endorsed by the [place] (ACS). The ACS recommends consideration of breast MRI screening as an adjunct to mammography for patients at high risk (defined as 20% or greater lifetime risk). A more detailed breast cancer risk assessment can be considered, if clinically indicated.   PLAN: After considering the risks, benefits, and limitations, Christy Morales. [place]  provided informed consent to pursue genetic testing and the blood sample was sent to [facility] for analysis of the 42-gene [facility] Common Hereditary Cancers Panel (Breast, Gyn, GI). Results should be available within approximately 2-3 weeks' time, at which point they will be disclosed by telephone to Christy Morales. [place], as will any additional recommendations warranted by these results. Christy Morales. [place] will receive a summary of her genetic counseling visit and a copy of her results once available. This information will also be available in Epic. We encouraged Christy Morales. [place] to remain in contact with cancer genetics annually so that we can continuously update the family history and inform her of any changes in cancer genetics and testing that may be of benefit for her family. Christy Morales. Brun questions were answered to her satisfaction today. Our contact information was provided should additional questions or concerns arise.  Thank you for the referral and allowing [place] to share in the care of your patient.   Jeanine Luz, Christy Morales, [facility] Certified Genetic Counselor [email or web address] Phone: [phone]  The patient was seen for a total of 60 minutes in face-to-face genetic counseling.  This patient was discussed with Drs. Magrinat, Lindi Adie and/or Burr Medico who agrees with the  above.    _______________________________________________________________________ For Office Staff:  Number of people involved in session: 2 Was an Intern/ student involved with case: yes

## 2016-03-07 DIAGNOSIS — Z1231 Encounter for screening mammogram for malignant neoplasm of breast: Secondary | ICD-10-CM | POA: Diagnosis not present

## 2016-03-07 DIAGNOSIS — Z13820 Encounter for screening for osteoporosis: Secondary | ICD-10-CM | POA: Diagnosis not present

## 2016-03-15 DIAGNOSIS — Z6826 Body mass index (BMI) 26.0-26.9, adult: Secondary | ICD-10-CM | POA: Diagnosis not present

## 2016-03-15 DIAGNOSIS — Z01419 Encounter for gynecological examination (general) (routine) without abnormal findings: Secondary | ICD-10-CM | POA: Diagnosis not present

## 2016-03-17 ENCOUNTER — Telehealth: Payer: Self-pay | Admitting: Genetic Counselor

## 2016-03-17 NOTE — Telephone Encounter (Signed)
Discussed with Ms. 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  Discussed that MUTYH-associated polyposis (MAP) is a condition that is inherited in a recessive pattern, thus, someone must inherit a mutation in BOTH copies of the gene to be considered to be at a high risk for colon polyps and colon cancer.  Discussed that individuals who have one MUTYH gene mutation may or may not be at a somewhat increased risk for colon polyps and cancer.  National Comprehensive Cancer Network guidelines suggest that affected individuals with a first-degree relative begin colonscopies at age 56 and get those every 5 years.  Ms. Shelle IronClance does not have a first-degree relative with colon cancer, but her father did have a history of esophageal and stomach cancers.  Thus, it may make sense to be more cautious and get her colonoscopies every 5 years.  She is currently on a 5-year schedule; she sees Dr. Ewing SchleinMagod.  Recommended genetic testing for her siblings and children who each have a 50% to have inherited this same mutation.  We also just want to ensure that none of these relatives have two MUTYH gene mutations.  No other gene mutations or any variants of uncertain significance (VUSes) were found on the 42-gene [facility] Common Hereditary Cancers Panel (Breast, Gyn, GI).  We are unaware of any increased risk for breast cancer caused by a single MUTYH gene mutation, at this time.  Ms. Shelle IronClance should follow her doctors recommendations for future cancer screening.  I will email her a copy of her results, a results letter, and a family letter to help share this information with relatives.  She knows she is welcome to call with any questions.

## 2016-03-21 ENCOUNTER — Ambulatory Visit: Payer: Self-pay | Admitting: Genetic Counselor

## 2016-03-21 DIAGNOSIS — Z800 Family history of malignant neoplasm of digestive organs: Secondary | ICD-10-CM

## 2016-03-21 DIAGNOSIS — Z803 Family history of malignant neoplasm of breast: Secondary | ICD-10-CM

## 2016-03-21 DIAGNOSIS — Z1379 Encounter for other screening for genetic and chromosomal anomalies: Secondary | ICD-10-CM

## 2016-03-21 DIAGNOSIS — Z1589 Genetic susceptibility to other disease: Secondary | ICD-10-CM

## 2016-03-21 DIAGNOSIS — Z809 Family history of malignant neoplasm, unspecified: Secondary | ICD-10-CM

## 2016-04-04 ENCOUNTER — Other Ambulatory Visit: Payer: Self-pay | Admitting: Obstetrics and Gynecology

## 2016-04-04 DIAGNOSIS — Z9189 Other specified personal risk factors, not elsewhere classified: Secondary | ICD-10-CM

## 2016-04-14 ENCOUNTER — Encounter: Payer: Self-pay | Admitting: Genetic Counselor

## 2016-04-14 DIAGNOSIS — Z1379 Encounter for other screening for genetic and chromosomal anomalies: Secondary | ICD-10-CM | POA: Insufficient documentation

## 2016-04-14 DIAGNOSIS — Z1589 Genetic susceptibility to other disease: Secondary | ICD-10-CM | POA: Insufficient documentation

## 2016-04-14 DIAGNOSIS — Z0000 Encounter for general adult medical examination without abnormal findings: Secondary | ICD-10-CM | POA: Insufficient documentation

## 2016-04-14 NOTE — Progress Notes (Signed)
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 Furniss was previously seen in the [facility] due to a family of cancer and concerns regarding a hereditary predisposition to cancer. Please refer to our prior cancer genetics clinic note from March 02, 2016 for more information regarding Ms. Elicker's medical, social and family histories, and our assessment and recommendations, at the time. Ms. Adney recent genetic test results were disclosed to her, as were recommendations warranted by these results. These results and recommendations are discussed in more detail below.   FAMILY HISTORY:  We obtained a detailed, 4-generation family history.  Significant diagnoses are listed below: Family History  Problem Relation Age of Onset  . High Cholesterol Father   . Heart disease Father   . Esophageal cancer Father 14    smoker and heavy drinker  . Stomach cancer Father 56    smoker and heavy drinker  . Breast cancer Mother 56  . High Cholesterol Mother   . Heart disease Mother   . Heart attack Mother     d. 69y  . Breast cancer Sister 56    d. 90y; took fertility hormones  . Other Sister     hx cervical ablation  . Osteoporosis Maternal Grandmother   . Osteoporosis Sister   . Hyperparathyroidism Sister     adenoma  . Cancer Maternal Grandfather 56     oral cancer dx 56s; smoker and SL tob user  . Pneumonia Paternal Grandmother 56s 56s     d. 56s  . Aneurysm Paternal Grandfather     brain; d. 56s    Patient's maternal ancestors are of [place] and [place] descent, and paternal ancestors are of [place] descent. There is no reported Ashkenazi Jewish ancestry. There is no known consanguinity.  GENETIC TEST RESULTS: At the time of Ms. Dexter's visit on 03/02/16, we recommended she pursue  genetic testing of the 42-gene [facility] Common Hereditary Cancers Panel (Breast, Gyn, GI) through [facility] in [place], [place]. This test included sequencing and/or deletion/duplication analysis of the following genes: APC, ATM, AXIN2, BARD1, BMPR1A, BRCA1, BRCA2, BRIP1, CDH1, CDKN2A, CHEK2, DICER1, EPCAM, GREM1, KIT, MEN1, MLH1, MSH2, MSH6, MUTYH, NBN, NF1, PALB2, PDGFRA, PMS2, POLD1, POLE, PTEN, RAD50, RAD51C, RAD51D, SDHA, SDHB, SDHC, SDHD, SMAD4, SMARCA4, STK11, TP53, TSC1, TSC2, and VHL. Ms. Clere was called recently with her genetic test results. Genetic testing identified a single, heterozygous pathogenic gene mutation in the MUTYH gene called "c.1187G>A (p.Gly396Asp)".  Since Ms. Burby has only one pathogenic mutation in MUTYH, she is NOT affected with MUTYH-associated polyposis (MAP), but instead is a carrier. A copy of the test report will be scanned into Epic and will be located under the Molecular Pathology section of the Results Review tab.   SCREENING RECOMMENDATIONS: We discussed the implications of a heterozygous MUTYH mutation for Ms. Stiehl, and discussed who else in the family should have genetic testing. We recommended Ms. Strawderman follow management guidelines for heterozygous MUTYH mutations; all of which are outlined below. These can be coordinated by Ms. Everson's GI doctor or her primary provider.   Personal history of colon cancer  Follow instructions provided by your physician based on your personal history.  Do not have a personal history of colon cancer but have a parent/sibling/child with colon cancer: Colonoscopy every 5 years starting at age 71 or 61  years younger than the earliest age of onset, whichever is younger.  Do not have a personal history of colon cancer but do not have a parent/sibling/child with colon cancer: Colonoscopy every 5 years starting at age 71.  FAMILY MEMBERS: Since we now know the mutation in Ms. Putman, we can test at-risk relatives to  determine whether or not they have inherited the mutation and are at increased risk for cancer. We will be happy to meet with any of the family members or refer them to a genetic counselor in their local area. To locate genetic counselors in other cities, individuals can visit the website of the [employer] ([email or web address]) and search for a Oncologist by zip code.   Ms. Jean children and siblings have a 50% chance to have this same MUTYH gene mutation, and, thus, will need to have genetic counseling and testing.  We do not yet know what side of the family this mutation may be coming from, so Ms. Mojica should let relatives on both her maternal and paternal sides know about this mutation and the recommendation for them to also undergo genetic counseling and testing.  FOLLOW-UP: We strongly encouraged Ms. Kiely to remain in contact with [place] in cancer genetics on an annual basis so we can update Ms. Lodato's personal and family histories, and inform her of advances in cancer genetics that may be of benefit for the entire family. Ms. Brossard knows she is also welcome to call with any questions or concerns, at any time.   Jeanine Luz, MS, [place] Certified Genetic Counselor Phone: [phone]

## 2016-04-26 ENCOUNTER — Encounter: Payer: Self-pay | Admitting: Physician Assistant

## 2016-04-26 ENCOUNTER — Ambulatory Visit (INDEPENDENT_AMBULATORY_CARE_PROVIDER_SITE_OTHER): Payer: Federal, State, Local not specified - PPO | Admitting: Physician Assistant

## 2016-04-26 ENCOUNTER — Ambulatory Visit (HOSPITAL_BASED_OUTPATIENT_CLINIC_OR_DEPARTMENT_OTHER)
Admission: RE | Admit: 2016-04-26 | Discharge: 2016-04-26 | Disposition: A | Discharge status: Home / Self Care | Payer: Federal, State, Local not specified - PPO | Source: Ambulatory Visit | Attending: Physician Assistant | Admitting: Physician Assistant

## 2016-04-26 VITALS — BP 117/70 | HR 81 | Temp 100.6°F | Resp 16 | Ht 65.0 in | Wt 156.0 lb

## 2016-04-26 DIAGNOSIS — J189 Pneumonia, unspecified organism: Secondary | ICD-10-CM | POA: Insufficient documentation

## 2016-04-26 DIAGNOSIS — J069 Acute upper respiratory infection, unspecified: Secondary | ICD-10-CM | POA: Diagnosis present

## 2016-04-26 DIAGNOSIS — J181 Lobar pneumonia, unspecified organism: Secondary | ICD-10-CM

## 2016-04-26 MED ORDER — AZITHROMYCIN 250 MG PO TABS: ORAL_TABLET | ORAL | 0 refills | Status: DC

## 2016-04-26 MED ORDER — ALBUTEROL SULFATE HFA 108 (90 BASE) MCG/ACT IN AERS: 2.0000 | INHALATION_SPRAY | Freq: Four times a day (QID) | RESPIRATORY_TRACT | 0 refills | Status: DC | PRN

## 2016-04-26 MED ORDER — BENZONATATE 100 MG PO CAPS: 100.0000 mg | ORAL_CAPSULE | Freq: Two times a day (BID) | ORAL | 0 refills | Status: DC | PRN

## 2016-04-26 MED ORDER — ALBUTEROL SULFATE (2.5 MG/3ML) 0.083% IN NEBU: 2.5000 mg | INHALATION_SOLUTION | Freq: Four times a day (QID) | RESPIRATORY_TRACT | 0 refills | Status: DC | PRN

## 2016-04-26 NOTE — Progress Notes (Addendum)
Subjective:    Patient ID: Christy Morales, female    DOB: 1960/06/28, 56 y.o.   MRN: [id number]  HPI  Christy Morales is a 56 y/o female who complains of chest and nasal congestion, cough, and fever, started on Sunday, she has been taking ibuprofen, Dayquil and Nyquil. She states that she has had non productive cough. Difficulty breathing at times, especially when she is laying down. Developed a fever of 101 this morning, which made her concerned and is why she came in today. Appetite variable. No history asthma or PNA. Did not take a flu shot this year. 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  Review of Systems  See HPI  Past Medical History:  Diagnosis Date  . DJD (degenerative joint disease)   . Family history of breast cancer   . History of shingles   . Osteoporosis    lower spine  . Paroxysmal supraventricular tachycardia [facility](HCC)      Social History   Social History  . Marital status: Married    Spouse name: Dr. Marcelyn BruinsKeith Brueggemann  . Number of children: N/A  . 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 Smoking status: Never Smoker  . Smokeless tobacco: Never Used  . Alcohol use 1.2 oz/week    2 Glasses of wine per week  . Drug use: No  . Sexual activity: Yes    Partners: Male   Other Topics Concern  . Not on file   Social History Narrative  . No narrative on file    Past Surgical History:  Procedure Laterality Date  . right knee arthroscopy for meniscus tear     Dr. Rennis ChrisSupple  . tubal pregnancy with D & C      Family History  Problem Relation Age of Onset  . High Cholesterol Father   . Heart disease Father   . Esophageal cancer Father 2172    smoker and heavy drinker  . Stomach cancer Father 572    smoker and heavy drinker  . Breast cancer Mother 3269  . High Cholesterol Mother   . Heart disease Mother   . Heart attack Mother     d. 3772y  . Breast cancer Sister 7856    d. 59y; took  fertility hormones  . Other Sister     hx cervical ablation  . Osteoporosis Maternal Grandmother   . Osteoporosis Sister   . Hyperparathyroidism Sister     adenoma  . Cancer Maternal Grandfather     oral cancer dx 9780s; smoker and SL tob user  . Pneumonia Paternal Grandmother     d. 30s  . Aneurysm Paternal Grandfather     brain; d. 7170s    Allergies  Allergen Reactions  . Penicillins   . Sulfamethoxazole-Trimethoprim     REACTION: rash    Current Outpatient Prescriptions on File Prior to Visit  Medication Sig Dispense Refill  . calcium citrate-vitamin D (CITRACAL+D) 315-200 MG-UNIT per tablet Take 2 tablets by mouth 2 (two) times daily.    . Multiple Vitamins-Minerals (MULTIVITAMIN & MINERAL PO) Take one tablet 2-3 times per week     No current facility-administered medications on file prior to visit.     BP 117/70 (BP Location: Right Arm, Cuff Size: Normal)   Pulse 81   Temp (!) 100.6 F (38.1 C) (Oral)   Resp 16   Ht 5'  5" (1.651 m)   Wt 156 lb (70.8 kg)   SpO2 93%   BMI 25.96 kg/m      Objective:   Physical Exam  Constitutional: Vital signs are normal. She is cooperative.  HENT:  Head: Normocephalic and atraumatic.  Right Ear: Tympanic membrane, external ear and ear canal normal. Tympanic membrane is not erythematous, not retracted and not bulging.  Left Ear: Tympanic membrane, external ear and ear canal normal. Tympanic membrane is not erythematous, not retracted and not bulging.  Mouth/Throat: Uvula is midline and mucous membranes are normal. Posterior oropharyngeal edema and posterior oropharyngeal erythema present.  Eyes: Conjunctivae are normal.  Neck: Trachea normal.  Cardiovascular: Normal rate, regular rhythm and normal heart sounds.   Pulmonary/Chest: Effort normal and breath sounds normal. No accessory muscle usage. No respiratory distress. She has no wheezes. Rhonchi: occasional ronchi throughout lung fields. She has no rales.  Neurological: She is  alert.  Nursing note and vitals reviewed.   CXR PA and lateral: RLL pneumonia     Assessment & Plan:  Community acquired pneumonia of RLL of lung (HCC) Unable to test for influenza, as we are currently out of rapid testing supplies, however she is out of the treatment window given today is day 4. Given rapidly worsening of symptoms and history, will r/o PNA with chest xray.   Addendum: chest xray shows RLL PNA. Start z-pack. Patient notified of results. She states that her daughter has a nebulizer machine and she would prefer to have albuterol solution instead of an albuterol inhaler. Also told patient to follow up with [place] in one week for re-check. I also told her that ultimately she will need another CXR in 3-4 weeks to assess if PNA has cleared. Patient verbalized understanding.  Jarold Motto PA-C 04/26/16

## 2016-04-26 NOTE — Patient Instructions (Signed)
It was great meeting you today!  Please go to the first floor to complete your chest xray, we will call you with the results and begin an antibiotic after we review the imaging.  Tessalon Perles as needed for cough.  Call us if your symptoms don't improve by beginning of next week, sooner if they worsen.

## 2016-04-26 NOTE — Progress Notes (Signed)
Pre visit review using our clinic review tool, if applicable. No additional management support is needed unless otherwise documented below in the visit note. 

## 2016-04-27 ENCOUNTER — Other Ambulatory Visit: Payer: Federal, State, Local not specified - PPO

## 2016-04-28 ENCOUNTER — Telehealth: Payer: Self-pay | Admitting: Family Medicine

## 2016-04-28 DIAGNOSIS — J189 Pneumonia, unspecified organism: Secondary | ICD-10-CM

## 2016-04-28 DIAGNOSIS — J181 Lobar pneumonia, unspecified organism: Principal | ICD-10-CM

## 2016-04-28 NOTE — Telephone Encounter (Addendum)
Relation to WU:JWJXpt:self Call back [phone]   Reason for call:  Patient requesting nebulizer machine Rx due to maching breaking please send Rx [facility] or [facility], please advice

## 2016-04-28 NOTE — Telephone Encounter (Signed)
Can we send one in for her?  She was in Wednesday and saw [place] was [place] with pneumonia.  She was prescribed albuterol solution.

## 2016-04-28 NOTE — Telephone Encounter (Signed)
Don't think ins will pay for it for pneumonia but we will try

## 2016-05-01 NOTE — Telephone Encounter (Signed)
Will call [facility] tomorrow about how to order via epic

## 2016-05-01 NOTE — Telephone Encounter (Signed)
Left message on machine that we will send in for a machine at [facility] but they may not pay.

## 2016-05-02 ENCOUNTER — Other Ambulatory Visit: Payer: Self-pay | Admitting: Family Medicine

## 2016-05-02 ENCOUNTER — Encounter: Payer: Self-pay | Admitting: Physician Assistant

## 2016-05-02 ENCOUNTER — Ambulatory Visit (INDEPENDENT_AMBULATORY_CARE_PROVIDER_SITE_OTHER): Payer: Federal, State, Local not specified - PPO | Admitting: Family Medicine

## 2016-05-02 VITALS — BP 98/72 | HR 51 | Temp 97.8°F | Wt 155.2 lb

## 2016-05-02 DIAGNOSIS — J9801 Acute bronchospasm: Secondary | ICD-10-CM

## 2016-05-02 DIAGNOSIS — J181 Lobar pneumonia, unspecified organism: Secondary | ICD-10-CM

## 2016-05-02 DIAGNOSIS — J189 Pneumonia, unspecified organism: Secondary | ICD-10-CM

## 2016-05-02 DIAGNOSIS — Z1159 Encounter for screening for other viral diseases: Secondary | ICD-10-CM

## 2016-05-02 MED ORDER — PREDNISONE 10 MG PO TABS: ORAL_TABLET | ORAL | 0 refills | Status: DC

## 2016-05-02 NOTE — Progress Notes (Signed)
Pre visit review using our clinic review tool, if applicable. No additional management support is needed unless otherwise documented below in the visit note. 

## 2016-05-02 NOTE — Patient Instructions (Signed)
  Please have a repeat xray done in 2 weeks    Community-Acquired Pneumonia, Adult Introduction Pneumonia is an infection of the lungs. One type of pneumonia can happen while a person is in a hospital. A different type can happen when a person is not in a hospital (community-acquired pneumonia). It is easy for this kind to spread from person to person. It can spread to you if you breathe near an infected person who coughs or sneezes. Some symptoms include:  A dry cough.  A wet (productive) cough.  Fever.  Sweating.  Chest pain. Follow these instructions at home:  Take over-the-counter and prescription medicines only as told by your doctor.  Only take cough medicine if you are losing sleep.  If you were prescribed an antibiotic medicine, take it as told by your doctor. Do not stop taking the antibiotic even if you start to feel better.  Sleep with your head and neck raised (elevated). You can do this by putting a few pillows under your head, or you can sleep in a recliner.  Do not use tobacco products. These include cigarettes, chewing tobacco, and e-cigarettes. If you need help quitting, ask your doctor.  Drink enough water to keep your pee (urine) clear or pale yellow. A shot (vaccine) can help prevent pneumonia. Shots are often suggested for:  People older than 57 years of age.  People older than 57 years of age:  Who are having cancer treatment.  Who have long-term (chronic) lung disease.  Who have problems with their body's defense system (immune system). You may also prevent pneumonia if you take these actions:  Get the flu (influenza) shot every year.  Go to the dentist as often as told.  Wash your hands often. If soap and water are not available, use hand sanitizer. Contact a doctor if:  You have a fever.  You lose sleep because your cough medicine does not help. Get help right away if:  You are short of breath and it gets worse.  You have more chest  pain.  Your sickness gets worse. This is very serious if:  You are an older adult.  Your body's defense system is weak.  You cough up blood. This information is not intended to replace advice given to you by your health care provider. Make sure you discuss any questions you have with your health care provider. Document Released: 09/20/2007 Document Revised: 09/09/2015 Document Reviewed: 07/29/2014  2017 Elsevier

## 2016-05-02 NOTE — Telephone Encounter (Signed)
Spoke with [facility] and they stated that they did get the order.  Lmom that [facility] was [facility] and to call back with any questions or concerns.

## 2016-05-02 NOTE — Progress Notes (Unsigned)
Patient ID: Christy Morales, female    DOB: 07/22/1960  Age: 56 y.o. MRN: [id number]    Subjective:  Subjective  HPI Christy Morales presents for ***  Review of Systems  History Past Medical History:  Diagnosis Date  . DJD (degenerative joint disease)   . Family history of breast cancer   . History of shingles   . Osteoporosis    lower spine  . Paroxysmal supraventricular tachycardia (HCC)     She has a past surgical history that includes tubal pregnancy with D & C and right knee arthroscopy for meniscus tear.   Her family history includes Aneurysm in her paternal grandfather; Breast cancer (age of onset: 1656) in her sister; Breast cancer (age of onset: 3669) in her mother; Cancer in her maternal grandfather; Esophageal cancer (age of onset: 4172) in her father; Heart attack in her mother; Heart disease in her father and mother; High Cholesterol in her father and mother; Hyperparathyroidism in her sister; Osteoporosis in her maternal grandmother and sister; Other in her sister; Pneumonia in her paternal grandmother; Stomach cancer (age of onset: 4172) in her father.She reports that she has never smoked. She has never used smokeless tobacco. She reports that she drinks about 1.2 oz of alcohol per week . She reports that she does not use drugs.  Current Outpatient Prescriptions on File Prior to Visit  Medication Sig Dispense Refill  . albuterol (PROVENTIL) (2.5 MG/3ML) 0.083% nebulizer solution Take 3 mLs (2.5 mg total) by nebulization every 6 (six) hours as needed for wheezing or shortness of breath. 75 mL 0  . azithromycin (ZITHROMAX) 250 MG tablet Take two tabs on day one, then one tab daily for 4 days 6 tablet 0  . benzonatate (TESSALON) 100 MG capsule Take 1 capsule (100 mg total) by mouth 2 (two) times daily as needed for cough. 20 capsule 0  . calcium citrate-vitamin D (CITRACAL+D) 315-200 MG-UNIT per tablet Take 2 tablets by mouth 2 (two) times daily.    . Multiple Vitamins-Minerals  (MULTIVITAMIN & MINERAL PO) Take one tablet 2-3 times per week     No current facility-administered medications on file prior to visit.      Objective:  Objective  Physical Exam There were no vitals taken for this visit. Wt Readings from Last 3 Encounters:  04/26/16 156 lb (70.8 kg)  01/20/16 155 lb 12.8 oz (70.7 kg)  06/12/14 150 lb 12.8 oz (68.4 kg)     Lab Results  Component Value Date   WBC 5.0 01/20/2016   HGB 14.5 01/20/2016   HCT 42.5 01/20/2016   PLT 201.0 01/20/2016   GLUCOSE 92 01/20/2016   CHOL 236 (H) 01/20/2016   TRIG 48.0 01/20/2016   HDL 88.90 01/20/2016   LDLDIRECT 143.9 01/25/2012   LDLCALC 138 (H) 01/20/2016   ALT 9 01/20/2016   AST 18 01/20/2016   NA 141 01/20/2016   K 3.8 01/20/2016   CL 102 01/20/2016   CREATININE 0.78 01/20/2016   BUN 13 01/20/2016   CO2 31 01/20/2016   TSH 2.42 01/20/2016    Dg Chest 2 View  Result Date: 04/26/2016 CLINICAL DATA:  Recent upper respiratory infection now with spiking fevers and chest tightness. EXAM: CHEST  2 VIEW COMPARISON:  Chest x-ray of January 26, 2012 FINDINGS: The lungs are well-expanded. There are patchy alveolar opacities in the right lower lobe which are new. The left lung is clear. There is no pleural effusion. The heart and pulmonary vascularity are normal. The  mediastinum is normal in width. The bony thorax is unremarkable. IMPRESSION: Right lower lobe pneumonia. Followup PA and lateral chest X-ray is recommended in 3-4 weeks following trial of antibiotic therapy to ensure resolution. Electronically Signed   By: David  [place] M.D.   On: 04/26/2016 12:45     Assessment & Plan:  Plan  I am having Christy Morales start on predniSONE. I am also having her maintain her Multiple Vitamins-Minerals (MULTIVITAMIN & MINERAL PO), calcium citrate-vitamin D, benzonatate, azithromycin, and albuterol.  Meds ordered this encounter  Medications  . predniSONE (DELTASONE) 10 MG tablet    Sig: 3 po qd x 3 days then 2 po qd  x 3 days then 1 po qd x 3 days    Dispense:  18 tablet    Refill:  0    Problem List Items Addressed This Visit    None    Visit Diagnoses    Community acquired pneumonia, unspecified laterality    -  Primary   Relevant Orders   DG Chest 2 View   Bronchospasm       Relevant Medications   predniSONE (DELTASONE) 10 MG tablet   Need for hepatitis C screening test       Relevant Orders   Hepatitis C antibody      Follow-up: No Follow-up on file.  Donato Schultz, DO

## 2016-05-02 NOTE — Addendum Note (Signed)
Addended by: Thelma BargeICHARDSON, SHEKETIA D on: 05/02/2016 08:28 AM   Modules accepted: Orders

## 2016-05-02 NOTE — Telephone Encounter (Signed)
Opened in error

## 2016-05-03 ENCOUNTER — Encounter: Payer: Self-pay | Admitting: Family Medicine

## 2016-05-03 DIAGNOSIS — J189 Pneumonia, unspecified organism: Secondary | ICD-10-CM | POA: Insufficient documentation

## 2016-05-03 DIAGNOSIS — J181 Lobar pneumonia, unspecified organism: Principal | ICD-10-CM

## 2016-05-03 NOTE — Progress Notes (Signed)
Patient ID: Christy Morales Morales, female    DOB: 11/16/1960  Age: 56 y.o. MRN: [id number]    Subjective:  Subjective  HPI Christy Morales Morales presents for f/u cap.  She feels much better--- feels great in am but by night she is coughing and does not feel great.    Review of Systems  Constitutional: Negative for chills and fever.  HENT: Negative for congestion, postnasal drip, rhinorrhea and sinus pressure.   Respiratory: Positive for cough, chest tightness and wheezing. Negative for shortness of breath.   Cardiovascular: Negative for chest pain, palpitations and leg swelling.  Allergic/Immunologic: Negative for environmental allergies.    History Past Medical History:  Diagnosis Date  . DJD (degenerative joint disease)   . Family history of breast cancer   . History of shingles   . Osteoporosis    lower spine  . Paroxysmal supraventricular tachycardia (HCC)     She has a past surgical history that includes tubal pregnancy with D & C and right knee arthroscopy for meniscus tear.   Her family history includes Aneurysm in her paternal grandfather; Breast cancer (age of onset: 45) in her sister; Breast cancer (age of onset: 71) in her mother; Cancer in her maternal grandfather; Esophageal cancer (age of onset: 26) in her father; Heart attack in her mother; Heart disease in her father and mother; High Cholesterol in her father and mother; Hyperparathyroidism in her sister; Osteoporosis in her maternal grandmother and sister; Other in her sister; Pneumonia in her paternal grandmother; Stomach cancer (age of onset: 29) in her father.She reports that she has never smoked. She has never used smokeless tobacco. She reports that she drinks about 1.2 oz of alcohol per week . She reports that she does not use drugs.  Current Outpatient Prescriptions on File Prior to Visit  Medication Sig Dispense Refill  . albuterol (PROVENTIL) (2.5 MG/3ML) 0.083% nebulizer solution Take 3 mLs (2.5 mg total) by nebulization  every 6 (six) hours as needed for wheezing or shortness of breath. 75 mL 0  . azithromycin (ZITHROMAX) 250 MG tablet Take two tabs on day one, then one tab daily for 4 days 6 tablet 0  . benzonatate (TESSALON) 100 MG capsule Take 1 capsule (100 mg total) by mouth 2 (two) times daily as needed for cough. 20 capsule 0  . calcium citrate-vitamin D (CITRACAL+D) 315-200 MG-UNIT per tablet Take 2 tablets by mouth 2 (two) times daily.    . Multiple Vitamins-Minerals (MULTIVITAMIN & MINERAL PO) Take one tablet 2-3 times per week     No current facility-administered medications on file prior to visit.      Objective:  Objective  Physical Exam  Constitutional: She is oriented to person, place, and time. She appears well-developed and well-nourished.  HENT:  Right Ear: External ear normal.  Left Ear: External ear normal.  + PND + errythema  Eyes: Conjunctivae are normal. Right eye exhibits no discharge. Left eye exhibits no discharge.  Cardiovascular: Normal rate, regular rhythm and normal heart sounds.   No murmur heard. Pulmonary/Chest: Effort normal. No respiratory distress. She has wheezes. She has no rales. She exhibits no tenderness.  Musculoskeletal: She exhibits no edema.  Lymphadenopathy:    She has no cervical adenopathy.  Neurological: She is alert and oriented to person, place, and time.  Psychiatric: She has a normal mood and affect. Her behavior is normal. Judgment and thought content normal.  Nursing note and vitals reviewed.  BP 98/72 (BP Location: Left Arm, Patient Position:  Sitting, Cuff Size: Normal)   Pulse (!) 51   Temp 97.8 F (36.6 C) (Oral)   Wt 155 lb 3.2 oz (70.4 kg)   SpO2 95%   BMI 25.83 kg/m  Wt Readings from Last 3 Encounters:  05/02/16 155 lb 3.2 oz (70.4 kg)  04/26/16 156 lb (70.8 kg)  01/20/16 155 lb 12.8 oz (70.7 kg)     Lab Results  Component Value Date   WBC 5.0 01/20/2016   HGB 14.5 01/20/2016   HCT 42.5 01/20/2016   PLT 201.0 01/20/2016    GLUCOSE 92 01/20/2016   CHOL 236 (H) 01/20/2016   TRIG 48.0 01/20/2016   HDL 88.90 01/20/2016   LDLDIRECT 143.9 01/25/2012   LDLCALC 138 (H) 01/20/2016   ALT 9 01/20/2016   AST 18 01/20/2016   NA 141 01/20/2016   K 3.8 01/20/2016   CL 102 01/20/2016   CREATININE 0.78 01/20/2016   BUN 13 01/20/2016   CO2 31 01/20/2016   TSH 2.42 01/20/2016    Dg Chest 2 View  Result Date: 04/26/2016 CLINICAL DATA:  Recent upper respiratory infection now with spiking fevers and chest tightness. EXAM: CHEST  2 VIEW COMPARISON:  Chest x-ray of January 26, 2012 FINDINGS: The lungs are well-expanded. There are patchy alveolar opacities in the right lower lobe which are new. The left lung is clear. There is no pleural effusion. The heart and pulmonary vascularity are normal. The mediastinum is normal in width. The bony thorax is unremarkable. IMPRESSION: Right lower lobe pneumonia. Followup PA and lateral chest X-ray is recommended in 3-4 weeks following trial of antibiotic therapy to ensure resolution. Electronically Signed   By: David  [place] M.D.   On: 04/26/2016 12:45     Assessment & Plan:  Plan  I am having Christy Morales Morales maintain her Multiple Vitamins-Minerals (MULTIVITAMIN & MINERAL PO), calcium citrate-vitamin D, benzonatate, azithromycin, and albuterol.  No orders of the defined types were placed in this encounter.   Problem List Items Addressed This Visit      Unprioritized   Community acquired pneumonia of right lower lobe of lung (HCC) - Primary    z pak finished Sunday pred taper con't cough meds Repeat cxr in 2 weeks Call sooner prn         Follow-up: Return if symptoms worsen or fail to improve.  Donato SchultzYvonne R Lowne Chase, DO

## 2016-05-03 NOTE — Assessment & Plan Note (Signed)
z pak finished Sunday pred taper con't cough meds Repeat cxr in 2 weeks Call sooner prn

## 2016-05-04 ENCOUNTER — Ambulatory Visit: Payer: Federal, State, Local not specified - PPO | Admitting: Family Medicine

## 2016-05-08 ENCOUNTER — Other Ambulatory Visit: Payer: Federal, State, Local not specified - PPO

## 2016-05-16 ENCOUNTER — Ambulatory Visit
Admission: RE | Admit: 2016-05-16 | Discharge: 2016-05-16 | Disposition: A | Discharge status: Home / Self Care | Payer: Federal, State, Local not specified - PPO | Source: Ambulatory Visit | Attending: Obstetrics and Gynecology | Admitting: Obstetrics and Gynecology

## 2016-05-16 DIAGNOSIS — Z9189 Other specified personal risk factors, not elsewhere classified: Secondary | ICD-10-CM

## 2016-05-16 DIAGNOSIS — Z803 Family history of malignant neoplasm of breast: Secondary | ICD-10-CM | POA: Diagnosis not present

## 2016-05-16 MED ORDER — GADOBENATE DIMEGLUMINE 529 MG/ML IV SOLN: 13.0000 mL | Freq: Once | INTRAVENOUS | Status: DC | PRN

## 2016-05-18 ENCOUNTER — Other Ambulatory Visit: Payer: Federal, State, Local not specified - PPO

## 2016-05-18 ENCOUNTER — Ambulatory Visit (INDEPENDENT_AMBULATORY_CARE_PROVIDER_SITE_OTHER)
Admission: RE | Admit: 2016-05-18 | Discharge: 2016-05-18 | Disposition: A | Discharge status: Home / Self Care | Payer: Federal, State, Local not specified - PPO | Source: Ambulatory Visit | Attending: Family Medicine | Admitting: Family Medicine

## 2016-05-18 DIAGNOSIS — Z1159 Encounter for screening for other viral diseases: Secondary | ICD-10-CM

## 2016-05-18 DIAGNOSIS — J189 Pneumonia, unspecified organism: Secondary | ICD-10-CM | POA: Diagnosis not present

## 2016-05-18 LAB — HEPATITIS C ANTIBODY: HCV Ab: NEGATIVE

## 2016-06-27 ENCOUNTER — Telehealth: Payer: Self-pay

## 2016-06-27 DIAGNOSIS — R9389 Abnormal findings on diagnostic imaging of other specified body structures: Secondary | ICD-10-CM

## 2016-06-27 NOTE — Telephone Encounter (Signed)
Left message on pt's vm to give the office a call back, in regards to pt's MRI results. I called to inform pt that I put in a order for US Abdomen per pt's request due to abnormal MRI. LB

## 2016-07-05 ENCOUNTER — Other Ambulatory Visit: Payer: Self-pay

## 2016-07-05 DIAGNOSIS — R9389 Abnormal findings on diagnostic imaging of other specified body structures: Secondary | ICD-10-CM

## 2016-07-05 NOTE — Progress Notes (Signed)
Left message on pt's vm to give the office a call back, in regards to results of MRI. Enter future orders for hepatic US, per provider request. LB

## 2016-07-06 ENCOUNTER — Other Ambulatory Visit: Payer: Self-pay | Admitting: Family Medicine

## 2016-07-06 DIAGNOSIS — R935 Abnormal findings on diagnostic imaging of other abdominal regions, including retroperitoneum: Secondary | ICD-10-CM

## 2016-07-06 DIAGNOSIS — R928 Other abnormal and inconclusive findings on diagnostic imaging of breast: Secondary | ICD-10-CM

## 2016-07-14 ENCOUNTER — Ambulatory Visit (HOSPITAL_COMMUNITY): Payer: Federal, State, Local not specified - PPO | Attending: Family Medicine

## 2016-07-24 ENCOUNTER — Other Ambulatory Visit: Payer: Federal, State, Local not specified - PPO

## 2016-07-27 ENCOUNTER — Ambulatory Visit
Admission: RE | Admit: 2016-07-27 | Discharge: 2016-07-27 | Disposition: A | Discharge status: Home / Self Care | Payer: Federal, State, Local not specified - PPO | Source: Ambulatory Visit | Attending: Family Medicine | Admitting: Family Medicine

## 2016-07-27 DIAGNOSIS — R9389 Abnormal findings on diagnostic imaging of other specified body structures: Secondary | ICD-10-CM

## 2016-07-27 DIAGNOSIS — K7689 Other specified diseases of liver: Secondary | ICD-10-CM | POA: Diagnosis not present

## 2016-09-20 ENCOUNTER — Telehealth: Payer: Self-pay | Admitting: Family Medicine

## 2016-09-20 DIAGNOSIS — R160 Hepatomegaly, not elsewhere classified: Secondary | ICD-10-CM

## 2016-09-20 NOTE — Telephone Encounter (Signed)
Caller name:Desha Gibbons Relationship to patient: Can be reached:[phone] Pharmacy:  Reason for call:Follow up on US results, does she need a MRI

## 2016-09-21 ENCOUNTER — Other Ambulatory Visit: Payer: Self-pay | Admitting: Family Medicine

## 2016-09-21 DIAGNOSIS — K6389 Other specified diseases of intestine: Secondary | ICD-10-CM

## 2016-09-21 DIAGNOSIS — R229 Localized swelling, mass and lump, unspecified: Principal | ICD-10-CM

## 2016-09-21 DIAGNOSIS — IMO0002 Reserved for concepts with insufficient information to code with codable children: Secondary | ICD-10-CM

## 2016-09-21 NOTE — Telephone Encounter (Signed)
MRI ordered and patient/PCC notified.(left her a message)

## 2016-09-21 NOTE — Telephone Encounter (Signed)
No auth required. I have sent order to [facility] to contact pt and schedule.

## 2016-09-21 NOTE — Telephone Encounter (Signed)
Yes =--  She needs a mri abd with hepatic protochol

## 2016-10-04 NOTE — Telephone Encounter (Signed)
Patient called office and stated that the MRI ordered was incorrect [09/21/16], after speaking with our Imaging department; she was distressed about getting this Imaging ordered and done, as her Original results on US that recommended MRI was in April 2018 [patient was informed, provider was out of town and pt chose to wait to schedule upon provider's return to office]. She also stated that her Insurance required her to have Imaging done at another facility [facility] on [address]] when her US were done, and the only time she could have MRI done here was on Saturdays. After researching and speaking with Dr. Carmelia RollerWendling, a MRI Liver w/wo contrast order was placed and verified all components of [facility] were [facility] order sent. Per Danford BadKristie, [employer] Insurance does not require authorization and order is [facility] [place] at [facility]. Called and spoke with patient to inform, gave her contact information for Imaging and she will call and schedule MRI at her discretion/SLS 06/20

## 2016-10-24 ENCOUNTER — Ambulatory Visit
Admission: RE | Admit: 2016-10-24 | Discharge: 2016-10-24 | Disposition: A | Discharge status: Home / Self Care | Payer: Federal, State, Local not specified - PPO | Source: Ambulatory Visit | Attending: Family Medicine | Admitting: Family Medicine

## 2016-10-24 DIAGNOSIS — R160 Hepatomegaly, not elsewhere classified: Secondary | ICD-10-CM

## 2016-10-24 DIAGNOSIS — K7689 Other specified diseases of liver: Secondary | ICD-10-CM | POA: Diagnosis not present

## 2016-10-24 MED ORDER — GADOBENATE DIMEGLUMINE 529 MG/ML IV SOLN
14.0000 mL | Freq: Once | INTRAVENOUS | Status: AC | PRN
  Administered 2016-10-24: 14 mL via INTRAVENOUS

## 2016-10-25 ENCOUNTER — Telehealth: Payer: Self-pay

## 2016-10-25 NOTE — Telephone Encounter (Signed)
Thank you ----   I will call GI tomorrow

## 2016-10-25 NOTE — Telephone Encounter (Signed)
Gave message to [place] after I found I was not connected to your basket. She  Kept an eye out for message and gave me the MRN but I haven't seen anything come through in her chart I am leaving for the day.

## 2016-12-15 DIAGNOSIS — M25562 Pain in left knee: Secondary | ICD-10-CM | POA: Diagnosis not present

## 2017-01-22 IMAGING — CR DG CHEST 2V
2 series · 2 of 2 positions shown · non-contrast
Comparison: Chest x-ray of January 26, 2012

CLINICAL DATA: Recent upper respiratory infection now with spiking
fevers and chest tightness.

EXAM:
CHEST  2 VIEW

[w chest pa]
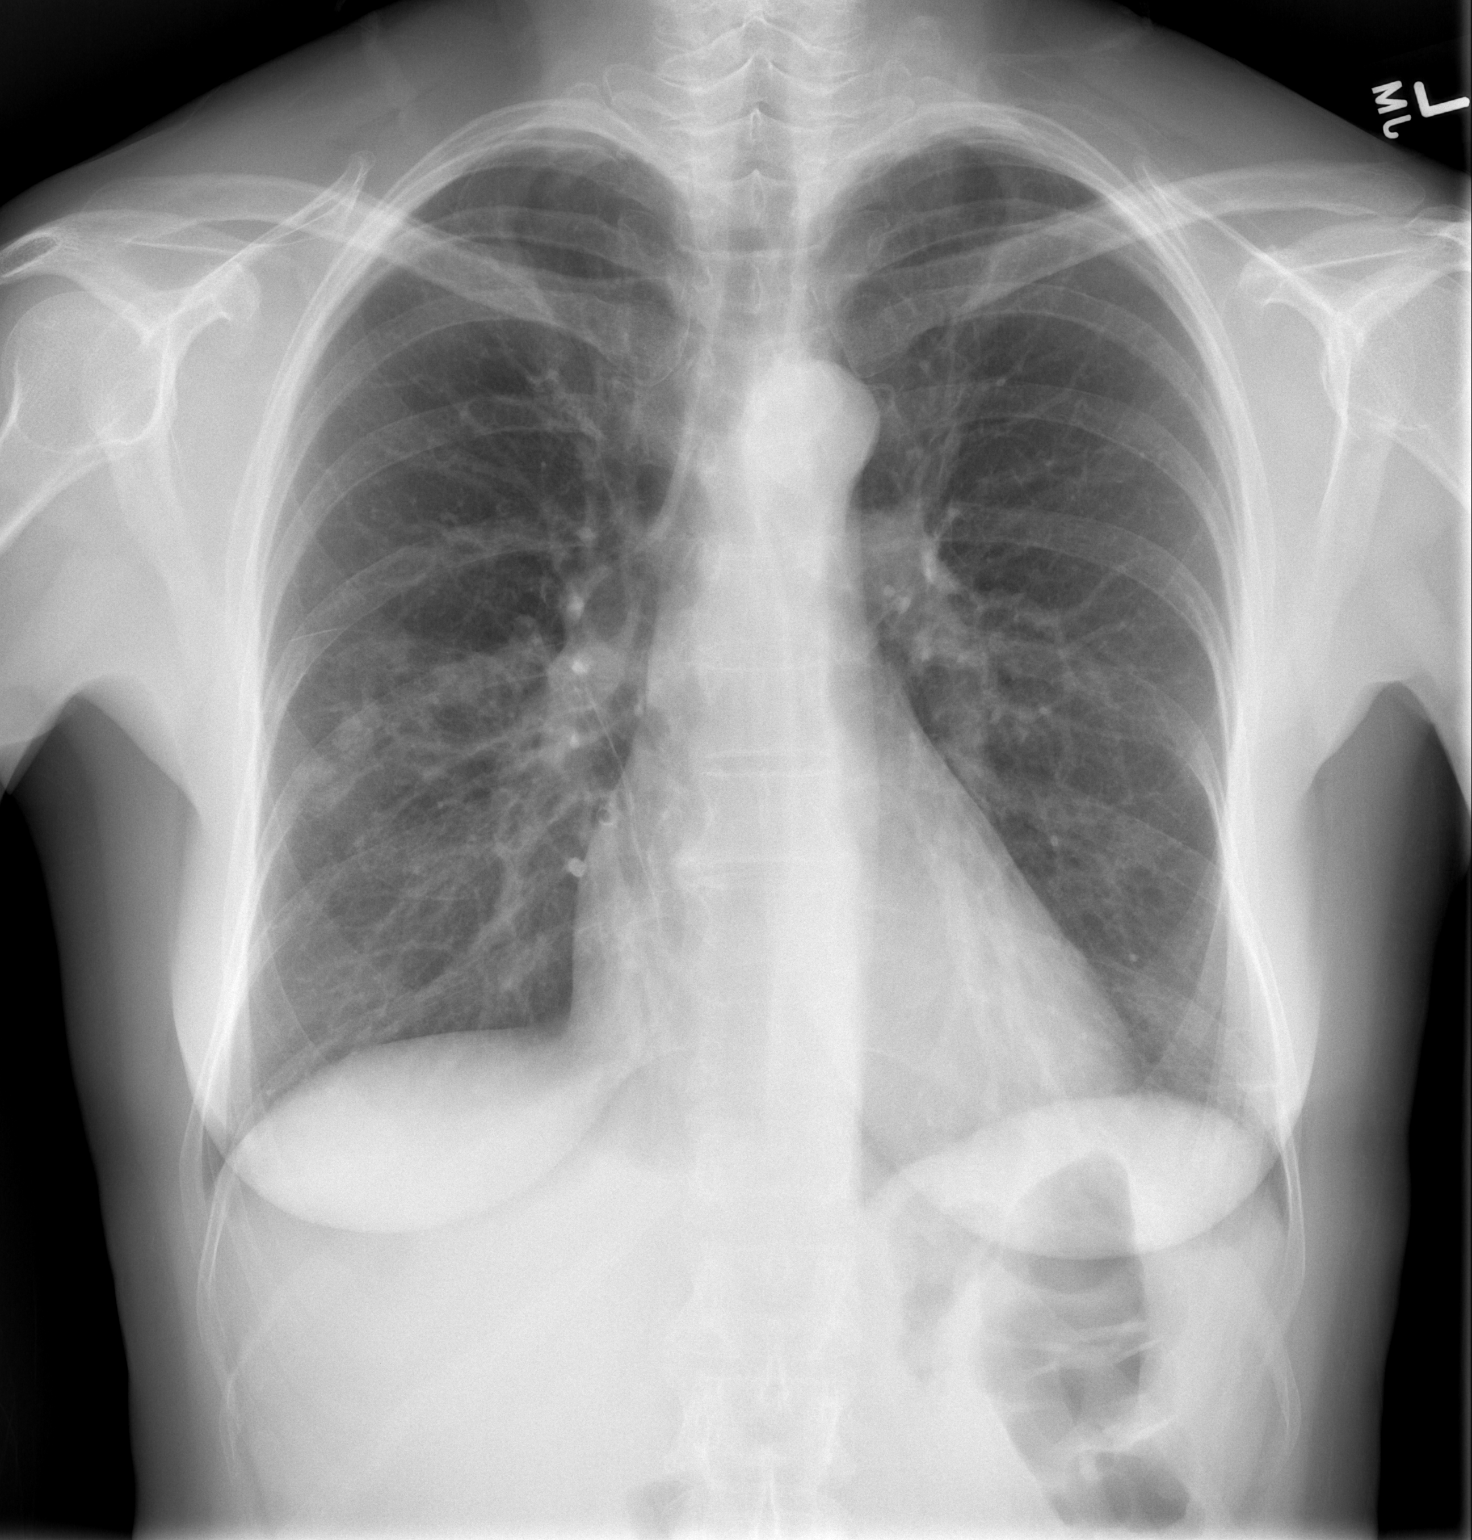

[w chest lat]
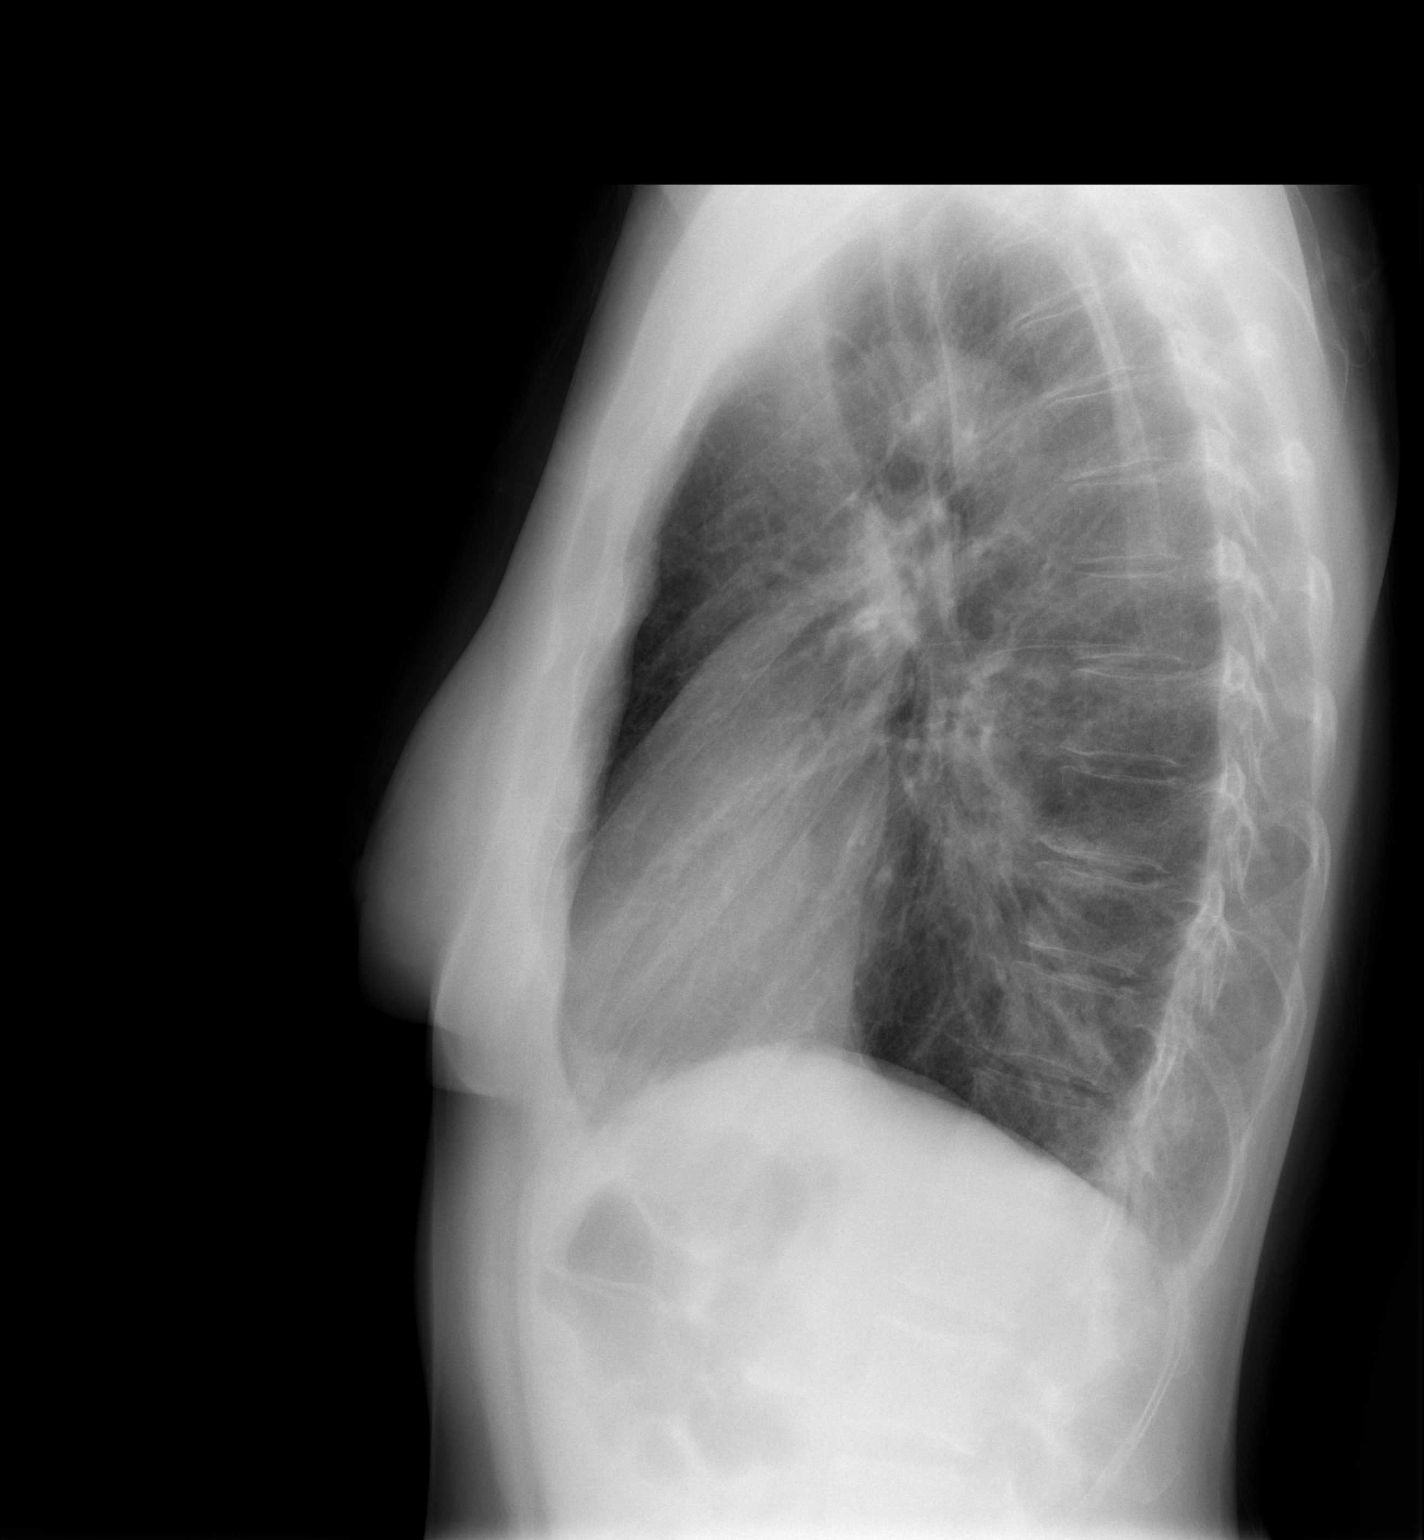

[2 of 2 positions shown; findings below may reference images not displayed]

FINDINGS: The lungs are well-expanded. There are patchy alveolar opacities in
the right lower lobe which are new. The left lung is clear. There is
no pleural effusion. The heart and pulmonary vascularity are normal.
The mediastinum is normal in width. The bony thorax is unremarkable.
IMPRESSION: Right lower lobe pneumonia. Followup PA and lateral chest X-ray is
recommended in 3-4 weeks following trial of antibiotic therapy to
ensure resolution.

## 2017-01-23 ENCOUNTER — Encounter: Payer: Federal, State, Local not specified - PPO | Admitting: Family Medicine

## 2017-01-26 DIAGNOSIS — M1712 Unilateral primary osteoarthritis, left knee: Secondary | ICD-10-CM | POA: Diagnosis not present

## 2017-01-26 DIAGNOSIS — M238X2 Other internal derangements of left knee: Secondary | ICD-10-CM | POA: Diagnosis not present

## 2017-01-26 DIAGNOSIS — M25562 Pain in left knee: Secondary | ICD-10-CM | POA: Diagnosis not present

## 2017-01-29 ENCOUNTER — Other Ambulatory Visit: Payer: Self-pay | Admitting: Specialist

## 2017-01-29 DIAGNOSIS — M238X2 Other internal derangements of left knee: Secondary | ICD-10-CM

## 2017-02-07 DIAGNOSIS — R932 Abnormal findings on diagnostic imaging of liver and biliary tract: Secondary | ICD-10-CM | POA: Diagnosis not present

## 2017-02-09 ENCOUNTER — Encounter: Payer: Self-pay | Admitting: Family Medicine

## 2017-02-09 ENCOUNTER — Ambulatory Visit (INDEPENDENT_AMBULATORY_CARE_PROVIDER_SITE_OTHER): Payer: Federal, State, Local not specified - PPO | Admitting: Family Medicine

## 2017-02-09 VITALS — BP 122/84 | HR 63 | Temp 98.1°F | Ht 65.0 in | Wt 157.0 lb

## 2017-02-09 DIAGNOSIS — M545 Low back pain, unspecified: Secondary | ICD-10-CM

## 2017-02-09 MED ORDER — METHOCARBAMOL 500 MG PO TABS: 500.0000 mg | ORAL_TABLET | Freq: Four times a day (QID) | ORAL | 1 refills | Status: DC | PRN

## 2017-02-09 MED ORDER — PREDNISONE 10 MG PO TABS: ORAL_TABLET | ORAL | 0 refills | Status: DC

## 2017-02-09 NOTE — Patient Instructions (Signed)

## 2017-02-09 NOTE — Progress Notes (Signed)
Patient ID: Shaivi F Osbourn, female    DOB: 02-Mar-1961  Age: 56 y.o. MRN: [id number]    Subjective:  Subjective  HPI Nezzie F Housewright presents for R hip pain.  Pain started this week.  She c/o spasms in R low back and hip.  She is taking ibuprofen which helps with pain   Review of Systems  Constitutional: Negative for appetite change, diaphoresis, fatigue and unexpected weight change.  Eyes: Negative for pain, redness and visual disturbance.  Respiratory: Negative for cough, chest tightness, shortness of breath and wheezing.   Cardiovascular: Negative for chest pain, palpitations and leg swelling.  Endocrine: Negative for cold intolerance, heat intolerance, polydipsia, polyphagia and polyuria.  Genitourinary: Negative for difficulty urinating, dysuria and frequency.  Neurological: Negative for dizziness, light-headedness, numbness and headaches.    History Past Medical History:  Diagnosis Date  . DJD (degenerative joint disease)   . Family history of breast cancer   . History of shingles   . Osteoporosis    lower spine  . Paroxysmal supraventricular tachycardia (HCC)     She has a past surgical history that includes tubal pregnancy with D & C and right knee arthroscopy for meniscus tear.   Her family history includes Aneurysm in her paternal grandfather; Breast cancer (age of onset: 26) in her sister; Breast cancer (age of onset: 19) in her mother; Cancer in her maternal grandfather; Esophageal cancer (age of onset: 43) in her father; Heart attack in her mother; Heart disease in her father and mother; High Cholesterol in her father and mother; Hyperparathyroidism in her sister; Osteoporosis in her maternal grandmother and sister; Other in her sister; Pneumonia in her paternal grandmother; Stomach cancer (age of onset: 16) in her father.She reports that she has never smoked. She has never used smokeless tobacco. She reports that she drinks about 1.2 oz of alcohol per week . She reports that she  does not use drugs.  Current Outpatient Prescriptions on File Prior to Visit  Medication Sig Dispense Refill  . calcium citrate-vitamin D (CITRACAL+D) 315-200 MG-UNIT per tablet Take 2 tablets by mouth 2 (two) times daily.     No current facility-administered medications on file prior to visit.      Objective:  Objective  Physical Exam  Constitutional: She is oriented to person, place, and time. She appears well-developed and well-nourished.  HENT:  Head: Normocephalic and atraumatic.  Eyes: Conjunctivae and EOM are normal.  Neck: Normal range of motion. Neck supple. No JVD present. Carotid bruit is not present. No thyromegaly present.  Cardiovascular: Normal rate, regular rhythm and normal heart sounds.   No murmur heard. Pulmonary/Chest: Effort normal and breath sounds normal. No respiratory distress. She has no wheezes. She has no rales. She exhibits no tenderness.  Musculoskeletal: She exhibits tenderness. She exhibits no edema.       Right hip: She exhibits decreased range of motion and tenderness. She exhibits normal strength.  Neurological: She is alert and oriented to person, place, and time.  Psychiatric: She has a normal mood and affect.  Nursing note and vitals reviewed.  BP 122/84   Pulse 63   Temp 98.1 F (36.7 C) (Oral)   Ht 5\' 5"  (1.651 m)   Wt 157 lb (71.2 kg)   SpO2 98%   BMI 26.13 kg/m  Wt Readings from Last 3 Encounters:  02/09/17 157 lb (71.2 kg)  05/02/16 155 lb 3.2 oz (70.4 kg)  04/26/16 156 lb (70.8 kg)     Lab Results  Component Value Date   WBC 5.0 01/20/2016   HGB 14.5 01/20/2016   HCT 42.5 01/20/2016   PLT 201.0 01/20/2016   GLUCOSE 92 01/20/2016   CHOL 236 (H) 01/20/2016   TRIG 48.0 01/20/2016   HDL 88.90 01/20/2016   LDLDIRECT 143.9 01/25/2012   LDLCALC 138 (H) 01/20/2016   ALT 9 01/20/2016   AST 18 01/20/2016   NA 141 01/20/2016   K 3.8 01/20/2016   CL 102 01/20/2016   CREATININE 0.78 01/20/2016   BUN 13 01/20/2016   CO2 31  01/20/2016   TSH 2.42 01/20/2016    Mr Liver W Wo Contrast  Result Date: 10/24/2016 CLINICAL DATA:  [age] year old female with history of indeterminate liver lesion noted on recent ultrasound examination. Followup study. EXAM: MRI ABDOMEN WITHOUT AND WITH CONTRAST TECHNIQUE: Multiplanar multisequence MR imaging of the abdomen was performed both before and after the administration of intravenous contrast. CONTRAST:  14mL MULTIHANCE GADOBENATE DIMEGLUMINE 529 MG/ML IV SOLN COMPARISON:  No prior abdominal MRI. Abdominal ultrasound 07/27/2016. FINDINGS: Comment: Some portions of the examination (in particular the several of the post gadolinium sequences) are limited by considerable patient respiratory motion. Lower chest: Unremarkable. Hepatobiliary: There are several lesions scattered throughout the liver which are T1 hypointense and T2 hyperintense, and do not enhance, compatible with cysts. The majority of these are simple in appearance, with the largest simple cysts measuring up to 2.6 cm in the periphery of segment 8 (axial image 10 of series 5). Two lesions in the right lobe of the liver in segment 6 demonstrate multiple tiny thin internal septations which do not demonstrate significant enhancement on post gadolinium imaging, measuring up to 1.6 cm (axial image 18 of series 8), compatible with mildly complex septated cysts. None of these lesions demonstrate diffusion restriction. No other suspicious hepatic lesions are noted. No intra or extrahepatic biliary ductal dilatation. Gallbladder is normal in appearance. Pancreas: No pancreatic mass. No pancreatic ductal dilatation. No pancreatic or peripancreatic fluid or inflammatory changes. Spleen:  Unremarkable. Adrenals/Urinary Tract: Bilateral kidneys and bilateral adrenal glands are normal in appearance. No hydroureteronephrosis in the visualized portions of the abdomen. Stomach/Bowel: Visualized portions are unremarkable. Vascular/Lymphatic: No aneurysm  identified in the visualized abdominal vasculature. No lymphadenopathy noted in the abdomen. Other: No significant volume of ascites noted in the visualized portions of the peritoneal cavity. Musculoskeletal: No aggressive osseous lesions are noted in the visualized portions of the skeleton. IMPRESSION: 1. Multiple hepatic lesions, as above, compatible with a combination of simple cysts and minimally complex (septated) hepatic cysts, as detailed above. Electronically Signed   By: Trudie Reedaniel  Entrikin M.D.   On: 10/24/2016 10:29     Assessment & Plan:  Plan  I have discontinued Ms. Marse's Multiple Vitamins-Minerals (MULTIVITAMIN & MINERAL PO), benzonatate, azithromycin, albuterol, and predniSONE. I am also having her start on methocarbamol and predniSONE. Additionally, I am having her maintain her calcium citrate-vitamin D.  Meds ordered this encounter  Medications  . methocarbamol (ROBAXIN) 500 MG tablet    Sig: Take 1 tablet (500 mg total) by mouth every 6 (six) hours as needed for muscle spasms.    Dispense:  30 tablet    Refill:  1  . predniSONE (DELTASONE) 10 MG tablet    Sig: TAKE 3 TABLETS PO QD FOR 3 DAYS THEN TAKE 2 TABLETS PO QD FOR 3 DAYS THEN TAKE 1 TABLET PO QD FOR 3 DAYS THEN TAKE 1/2 TAB PO QD FOR 3 DAYS    Dispense:  20 tablet  Refill:  0    Problem List Items Addressed This Visit      Unprioritized   Acute right-sided low back pain without sciatica - Primary    Cont IB Take muscle relaxer  ICE, alt heat rto prn       Relevant Medications   methocarbamol (ROBAXIN) 500 MG tablet   predniSONE (DELTASONE) 10 MG tablet      Follow-up: Return if symptoms worsen or fail to improve.  Donato Schultz, DO

## 2017-02-09 NOTE — Assessment & Plan Note (Signed)
Cont IB Take muscle relaxer  ICE, alt heat rto prn

## 2017-02-12 ENCOUNTER — Ambulatory Visit
Admission: RE | Admit: 2017-02-12 | Discharge: 2017-02-12 | Disposition: A | Discharge status: Home / Self Care | Payer: Federal, State, Local not specified - PPO | Source: Ambulatory Visit | Attending: Specialist | Admitting: Specialist

## 2017-02-12 DIAGNOSIS — M25562 Pain in left knee: Secondary | ICD-10-CM | POA: Diagnosis not present

## 2017-02-12 DIAGNOSIS — M238X2 Other internal derangements of left knee: Secondary | ICD-10-CM

## 2017-02-19 DIAGNOSIS — M1712 Unilateral primary osteoarthritis, left knee: Secondary | ICD-10-CM | POA: Diagnosis not present

## 2017-02-19 DIAGNOSIS — M7122 Synovial cyst of popliteal space [Baker], left knee: Secondary | ICD-10-CM | POA: Diagnosis not present

## 2017-02-19 DIAGNOSIS — M25562 Pain in left knee: Secondary | ICD-10-CM | POA: Diagnosis not present

## 2017-03-06 ENCOUNTER — Encounter: Payer: Self-pay | Admitting: Family Medicine

## 2017-03-06 ENCOUNTER — Ambulatory Visit (INDEPENDENT_AMBULATORY_CARE_PROVIDER_SITE_OTHER): Payer: Federal, State, Local not specified - PPO | Admitting: Family Medicine

## 2017-03-06 VITALS — BP 120/80 | HR 69 | Temp 97.8°F | Resp 16 | Ht 65.0 in | Wt 160.0 lb

## 2017-03-06 DIAGNOSIS — Z0000 Encounter for general adult medical examination without abnormal findings: Secondary | ICD-10-CM | POA: Diagnosis not present

## 2017-03-06 NOTE — Patient Instructions (Signed)
Preventive Care 40-64 Years, Female Preventive care refers to lifestyle choices and visits with your health care provider that can promote health and wellness. What does preventive care include?  A yearly physical exam. This is also called an annual well check.  Dental exams once or twice a year.  Routine eye exams. Ask your health care provider how often you should have your eyes checked.  Personal lifestyle choices, including: ? Daily care of your teeth and gums. ? Regular physical activity. ? Eating a healthy diet. ? Avoiding tobacco and drug use. ? Limiting alcohol use. ? Practicing safe sex. ? Taking low-dose aspirin daily starting at age 58. ? Taking vitamin and mineral supplements as recommended by your health care provider. What happens during an annual well check? The services and screenings done by your health care provider during your annual well check will depend on your age, overall health, lifestyle risk factors, and family history of disease. Counseling Your health care provider may ask you questions about your:  Alcohol use.  Tobacco use.  Drug use.  Emotional well-being.  Home and relationship well-being.  Sexual activity.  Eating habits.  Work and work Statistician.  Method of birth control.  Menstrual cycle.  Pregnancy history.  Screening You may have the following tests or measurements:  Height, weight, and BMI.  Blood pressure.  Lipid and cholesterol levels. These may be checked every 5 years, or more frequently if you are over 81 years old.  Skin check.  Lung cancer screening. You may have this screening every year starting at age 78 if you have a 30-pack-year history of smoking and currently smoke or have quit within the past 15 years.  Fecal occult blood test (FOBT) of the stool. You may have this test every year starting at age 65.  Flexible sigmoidoscopy or colonoscopy. You may have a sigmoidoscopy every 5 years or a colonoscopy  every 10 years starting at age 30.  Hepatitis C blood test.  Hepatitis B blood test.  Sexually transmitted disease (STD) testing.  Diabetes screening. This is done by checking your blood sugar (glucose) after you have not eaten for a while (fasting). You may have this done every 1-3 years.  Mammogram. This may be done every 1-2 years. Talk to your health care provider about when you should start having regular mammograms. This may depend on whether you have a family history of breast cancer.  BRCA-related cancer screening. This may be done if you have a family history of breast, ovarian, tubal, or peritoneal cancers.  Pelvic exam and Pap test. This may be done every 3 years starting at age 80. Starting at age 36, this may be done every 5 years if you have a Pap test in combination with an HPV test.  Bone density scan. This is done to screen for osteoporosis. You may have this scan if you are at high risk for osteoporosis.  Discuss your test results, treatment options, and if necessary, the need for more tests with your health care provider. Vaccines Your health care provider may recommend certain vaccines, such as:  Influenza vaccine. This is recommended every year.  Tetanus, diphtheria, and acellular pertussis (Tdap, Td) vaccine. You may need a Td booster every 10 years.  Varicella vaccine. You may need this if you have not been vaccinated.  Zoster vaccine. You may need this after age 5.  Measles, mumps, and rubella (MMR) vaccine. You may need at least one dose of MMR if you were born in  1957 or later. You may also need a second dose.  Pneumococcal 13-valent conjugate (PCV13) vaccine. You may need this if you have certain conditions and were not previously vaccinated.  Pneumococcal polysaccharide (PPSV23) vaccine. You may need one or two doses if you smoke cigarettes or if you have certain conditions.  Meningococcal vaccine. You may need this if you have certain  conditions.  Hepatitis A vaccine. You may need this if you have certain conditions or if you travel or work in places where you may be exposed to hepatitis A.  Hepatitis B vaccine. You may need this if you have certain conditions or if you travel or work in places where you may be exposed to hepatitis B.  Haemophilus influenzae type b (Hib) vaccine. You may need this if you have certain conditions.  Talk to your health care provider about which screenings and vaccines you need and how often you need them. This information is not intended to replace advice given to you by your health care provider. Make sure you discuss any questions you have with your health care provider. Document Released: 04/30/2015 Document Revised: 12/22/2015 Document Reviewed: 02/02/2015 Elsevier Interactive Patient Education  2017 [employer].

## 2017-03-06 NOTE — Progress Notes (Signed)
Subjective:     Christy Morales is a 56 y.o. female and is here for a comprehensive physical exam. The patient reports no problems.  Social History   Socioeconomic History  . Marital status: Married    Spouse name: Dr. Marcelyn BruinsKeith Trovato  . Number of children: Not on file  . Years of education: Not on file  . Highest education level: Not on file  Social Needs  . Financial resource strain: Not on file  . Food insecurity - worry: Not on file  . Food insecurity - inability: Not on file  . Transportation needs - medical: Not on file  . Transportation needs - non-medical: Not on file  Occupational History  . Occupation: nursing with the [employer]  Tobacco Use  . Smoking status: Never Smoker  . Smokeless tobacco: Never Used  Substance and Sexual Activity  . Alcohol use: Yes    Alcohol/week: 1.2 oz    Types: 2 Glasses of wine per week  . Drug use: No  . Sexual activity: Yes    Partners: Male  Other Topics Concern  . Not on file  Social History Narrative   Exercise twice a week   Health Maintenance  Topic Date Due  . MAMMOGRAM  09/16/2014  . PAP SMEAR  09/15/2016  . INFLUENZA VACCINE  03/12/2017 (Originally 11/15/2016)  . TETANUS/TDAP  04/29/2018  . COLONOSCOPY  06/05/2024  . Hepatitis C Screening  Completed  . HIV Screening  Completed    The following portions of the patient's history were reviewed and updated as appropriate:  She  has a past medical history of DJD (degenerative joint disease), Family history of breast cancer, History of shingles, Osteoporosis, and Paroxysmal supraventricular tachycardia (HCC). She does not have any pertinent problems on file. She  has a past surgical history that includes tubal pregnancy with D & C and right knee arthroscopy for meniscus tear. Her family history includes Aneurysm in her paternal grandfather; Breast cancer (age of onset: 7556) in her sister; Breast cancer (age of onset: 6069) in her mother; Cancer in her maternal grandfather; Esophageal  cancer (age of onset: 5172) in her father; Heart attack in her mother; Heart disease in her father and mother; High Cholesterol in her father and mother; Hyperparathyroidism in her sister; Osteoporosis in her maternal grandmother and sister; Other in her sister; Pneumonia in her paternal grandmother; Stomach cancer (age of onset: 1272) in her father. She  reports that  has never smoked. she has never used smokeless tobacco. She reports that she drinks about 1.2 oz of alcohol per week. She reports that she does not use drugs. She has a current medication list which includes the following prescription(s): calcium citrate-vitamin d. Current Outpatient Medications on File Prior to Visit  Medication Sig Dispense Refill  . calcium citrate-vitamin D (CITRACAL+D) 315-200 MG-UNIT per tablet Take 2 tablets by mouth 2 (two) times daily.     No current facility-administered medications on file prior to visit.    She is allergic to penicillins and sulfamethoxazole-trimethoprim..  Review of Systems Review of Systems  Constitutional: Negative for activity change, appetite change and fatigue.  HENT: Negative for hearing loss, congestion, tinnitus and ear discharge.  dentist q3233m Eyes: Negative for visual disturbance (see optho q1y -- vision corrected to 20/20 with glasses).  Respiratory: Negative for cough, chest tightness and shortness of breath.   Cardiovascular: Negative for chest pain, palpitations and leg swelling.  Gastrointestinal: Negative for abdominal pain, diarrhea, constipation and abdominal distention.  Genitourinary: Negative for urgency, frequency, decreased urine volume and difficulty urinating.  Musculoskeletal: Negative for back pain, arthralgias and gait problem.  Skin: Negative for color change, pallor and rash.  Neurological: Negative for dizziness, light-headedness, numbness and headaches.  Hematological: Negative for adenopathy. Does not bruise/bleed easily.  Psychiatric/Behavioral:  Negative for suicidal ideas, confusion, sleep disturbance, self-injury, dysphoric mood, decreased concentration and agitation.       Objective:    BP 120/80 (BP Location: Right Arm, Cuff Size: Normal)   Pulse 69   Temp 97.8 F (36.6 C) (Oral)   Resp 16   Ht 5\' 5"  (1.651 m)   Wt 160 lb (72.6 kg)   SpO2 98%   BMI 26.63 kg/m  General appearance: alert, cooperative, appears stated age and no distress Head: Normocephalic, without obvious abnormality, atraumatic Eyes: conjunctivae/corneas clear. PERRL, EOM's intact. Fundi benign. Ears: normal TM's and external ear canals both ears Nose: Nares normal. Septum midline. Mucosa normal. No drainage or sinus tenderness. Throat: lips, mucosa, and tongue normal; teeth and gums normal Neck: no adenopathy, no carotid bruit, no JVD, supple, symmetrical, trachea midline and thyroid not enlarged, symmetric, no tenderness/mass/nodules Back: symmetric, no curvature. ROM normal. No CVA tenderness. Lungs: clear to auscultation bilaterally Breasts: gyn Heart: regular rate and rhythm, S1, S2 normal, no murmur, click, rub or gallop  Abdomen: soft, non-tender; bowel sounds normal; no masses,  no organomegaly Pelvic: deferred Extremities: extremities normal, atraumatic, no cyanosis or edema Pulses: 2+ and symmetric Skin: Skin color, texture, turgor normal. No rashes or lesions Lymph nodes: Cervical, supraclavicular, and axillary nodes normal. Neurologic: Alert and oriented X 3, normal strength and tone. Normal symmetric reflexes. Normal coordination and gait    Assessment:    Healthy female exam.      Plan:    ghm utd Check labs  Discussed shingrix with the pt -- she will wait  See After Visit Summary for Counseling Recommendations

## 2017-03-21 DIAGNOSIS — M1712 Unilateral primary osteoarthritis, left knee: Secondary | ICD-10-CM | POA: Diagnosis not present

## 2017-03-28 DIAGNOSIS — M1712 Unilateral primary osteoarthritis, left knee: Secondary | ICD-10-CM | POA: Diagnosis not present

## 2017-04-04 DIAGNOSIS — M1712 Unilateral primary osteoarthritis, left knee: Secondary | ICD-10-CM | POA: Diagnosis not present

## 2017-04-20 ENCOUNTER — Other Ambulatory Visit: Payer: Federal, State, Local not specified - PPO

## 2017-04-24 DIAGNOSIS — H4423 Degenerative myopia, bilateral: Secondary | ICD-10-CM | POA: Diagnosis not present

## 2017-04-24 DIAGNOSIS — H10413 Chronic giant papillary conjunctivitis, bilateral: Secondary | ICD-10-CM | POA: Diagnosis not present

## 2017-05-09 ENCOUNTER — Telehealth: Payer: Self-pay | Admitting: Family Medicine

## 2017-05-09 DIAGNOSIS — Z0000 Encounter for general adult medical examination without abnormal findings: Secondary | ICD-10-CM

## 2017-05-09 DIAGNOSIS — E7800 Pure hypercholesterolemia, unspecified: Secondary | ICD-10-CM

## 2017-05-09 NOTE — Telephone Encounter (Signed)
Copied from CRM [phone]. Topic: Quick Communication - See Telephone Encounter >> Autumm 23, 2019 11:47 AM Cipriano BunkerLambe, Annette S wrote: CRM for notification. See Telephone encounter for:   Pt. Called had tried to go to have labs done from her physical in Nov. and lab keeps telling her that the doctor Laury AxonLowne put order in for labs to be done in Feb.   She did go on 1/4 checked in and was told no.   Lab said they have tried to contact doctor several times but no response.  Pt is asking to have this lab order canceled and another order put in so can go ahead and get them done.   Please call patient clarify  05/09/17.

## 2017-05-10 NOTE — Telephone Encounter (Signed)
Labs reordered old labs taken out.  Patient notified.

## 2017-05-16 DIAGNOSIS — M1712 Unilateral primary osteoarthritis, left knee: Secondary | ICD-10-CM | POA: Diagnosis not present

## 2017-05-28 ENCOUNTER — Other Ambulatory Visit (INDEPENDENT_AMBULATORY_CARE_PROVIDER_SITE_OTHER): Payer: Federal, State, Local not specified - PPO

## 2017-05-28 DIAGNOSIS — Z0000 Encounter for general adult medical examination without abnormal findings: Secondary | ICD-10-CM

## 2017-05-28 DIAGNOSIS — E7800 Pure hypercholesterolemia, unspecified: Secondary | ICD-10-CM | POA: Diagnosis not present

## 2017-05-28 LAB — COMPREHENSIVE METABOLIC PANEL
ALBUMIN: 4.3 g/dL (ref 3.5–5.2)
ALK PHOS: 51 U/L (ref 39–117)
ALT: 8 U/L (ref 0–35)
AST: 13 U/L (ref 0–37)
BUN: 23 mg/dL (ref 6–23)
CO2: 26 mEq/L (ref 19–32)
CREATININE: 0.86 mg/dL (ref 0.40–1.20)
Calcium: 9.5 mg/dL (ref 8.4–10.5)
Chloride: 105 mEq/L (ref 96–112)
GFR: 72.01 mL/min (ref >60.00)
Glucose, Bld: 90 mg/dL (ref 70–99)
POTASSIUM: 4.3 meq/L (ref 3.5–5.1)
SODIUM: 142 meq/L (ref 135–145)
TOTAL PROTEIN: 6.9 g/dL (ref 6.0–8.3)
Total Bilirubin: 0.7 mg/dL (ref 0.2–1.2)

## 2017-05-28 LAB — CBC WITH DIFFERENTIAL/PLATELET
BASOS PCT: 1.3 %
Basophils Absolute: 51 cells/uL (ref 0–200)
EOS PCT: 2.6 %
Eosinophils Absolute: 101 cells/uL (ref 15–500)
HCT: 40.8 % (ref 35.0–45.0)
HEMOGLOBIN: 13.8 g/dL (ref 11.7–15.5)
LYMPHS ABS: 1416 {cells}/uL (ref 850–3900)
MCH: 31.8 pg (ref 27.0–33.0)
MCHC: 33.8 g/dL (ref 32.0–36.0)
MCV: 94 fL (ref 80.0–100.0)
MPV: 11 fL (ref 7.5–12.5)
Monocytes Relative: 7.9 %
NEUTROS ABS: 2024 {cells}/uL (ref 1500–7800)
Neutrophils Relative %: 51.9 %
Platelets: 193 10*3/uL (ref 140–400)
RBC: 4.34 10*6/uL (ref 3.80–5.10)
RDW: 12.1 % (ref 11.0–15.0)
Total Lymphocyte: 36.3 %
WBC mixed population: 308 cells/uL (ref 200–950)
WBC: 3.9 10*3/uL (ref 3.8–10.8)

## 2017-05-28 LAB — LIPID PANEL
CHOLESTEROL: 217 mg/dL — AB (ref 0–200)
HDL: 67.9 mg/dL (ref >39.00)
LDL Cholesterol: 135 mg/dL — ABNORMAL HIGH (ref 0–99)
NonHDL: 149.25
Total CHOL/HDL Ratio: 3
Triglycerides: 70 mg/dL (ref 0.0–149.0)
VLDL: 14 mg/dL (ref 0.0–40.0)

## 2017-05-28 LAB — TSH: TSH: 4.47 u[IU]/mL (ref 0.35–4.50)

## 2017-05-28 NOTE — Addendum Note (Signed)
Addended by: Clarice PoleHINESLEY, Lional Icenogle M on: 05/28/2017 08:03 AM   Modules accepted: Orders

## 2017-06-01 ENCOUNTER — Other Ambulatory Visit: Payer: Self-pay | Admitting: *Deleted

## 2017-06-01 DIAGNOSIS — E7800 Pure hypercholesterolemia, unspecified: Secondary | ICD-10-CM

## 2017-06-18 DIAGNOSIS — Z6827 Body mass index (BMI) 27.0-27.9, adult: Secondary | ICD-10-CM | POA: Diagnosis not present

## 2017-06-18 DIAGNOSIS — Z01419 Encounter for gynecological examination (general) (routine) without abnormal findings: Secondary | ICD-10-CM | POA: Diagnosis not present

## 2017-06-18 DIAGNOSIS — Z1231 Encounter for screening mammogram for malignant neoplasm of breast: Secondary | ICD-10-CM | POA: Diagnosis not present

## 2017-06-25 ENCOUNTER — Other Ambulatory Visit: Payer: Self-pay | Admitting: Obstetrics and Gynecology

## 2017-06-25 DIAGNOSIS — Z803 Family history of malignant neoplasm of breast: Secondary | ICD-10-CM

## 2017-07-02 ENCOUNTER — Encounter: Payer: Self-pay | Admitting: Family Medicine

## 2017-07-02 DIAGNOSIS — E039 Hypothyroidism, unspecified: Secondary | ICD-10-CM

## 2017-07-03 NOTE — Telephone Encounter (Signed)
hypothyroid

## 2017-07-03 NOTE — Telephone Encounter (Signed)
Lab order was put in--- add TSH and she can come in for labs

## 2017-07-03 NOTE — Telephone Encounter (Signed)
What would you like to link for diagnosis.

## 2017-07-17 ENCOUNTER — Ambulatory Visit
Admission: RE | Admit: 2017-07-17 | Discharge: 2017-07-17 | Disposition: A | Discharge status: Home / Self Care | Payer: Federal, State, Local not specified - PPO | Source: Ambulatory Visit | Attending: Obstetrics and Gynecology | Admitting: Obstetrics and Gynecology

## 2017-07-17 DIAGNOSIS — Z803 Family history of malignant neoplasm of breast: Secondary | ICD-10-CM | POA: Diagnosis not present

## 2017-07-17 MED ORDER — GADOBENATE DIMEGLUMINE 529 MG/ML IV SOLN
14.0000 mL | Freq: Once | INTRAVENOUS | Status: AC | PRN
  Administered 2017-07-17: 14 mL via INTRAVENOUS

## 2017-10-24 ENCOUNTER — Encounter: Payer: Self-pay | Admitting: Family Medicine

## 2017-10-24 ENCOUNTER — Other Ambulatory Visit: Payer: Self-pay | Admitting: Family Medicine

## 2017-10-24 MED ORDER — ONDANSETRON HCL 4 MG PO TABS: 4.0000 mg | ORAL_TABLET | Freq: Three times a day (TID) | ORAL | 0 refills | Status: DC | PRN

## 2017-10-24 MED ORDER — AZITHROMYCIN 250 MG PO TABS: ORAL_TABLET | ORAL | 0 refills | Status: DC

## 2017-10-24 NOTE — Telephone Encounter (Signed)
Ok to do labs now --- or she can do them when she gets back

## 2017-10-26 ENCOUNTER — Telehealth: Payer: Self-pay

## 2017-10-26 NOTE — Telephone Encounter (Signed)
Per Dr. Laury AxonLowne, pt. can have TSH completed either before or after her trip. Author left VM relaying information. OK for [facility] to schedule early AM fasting lab appointment for TSH, lipid panel, and CMP per Dr. Ernst SpellLowne's orders.

## 2017-11-16 ENCOUNTER — Other Ambulatory Visit (INDEPENDENT_AMBULATORY_CARE_PROVIDER_SITE_OTHER): Payer: Federal, State, Local not specified - PPO

## 2017-11-16 DIAGNOSIS — E7800 Pure hypercholesterolemia, unspecified: Secondary | ICD-10-CM

## 2017-11-16 DIAGNOSIS — E039 Hypothyroidism, unspecified: Secondary | ICD-10-CM

## 2017-11-16 LAB — COMPREHENSIVE METABOLIC PANEL
ALBUMIN: 4.5 g/dL (ref 3.5–5.2)
ALT: 7 U/L (ref 0–35)
AST: 16 U/L (ref 0–37)
Alkaline Phosphatase: 64 U/L (ref 39–117)
BUN: 20 mg/dL (ref 6–23)
CHLORIDE: 103 meq/L (ref 96–112)
CO2: 29 meq/L (ref 19–32)
CREATININE: 0.87 mg/dL (ref 0.40–1.20)
Calcium: 10.1 mg/dL (ref 8.4–10.5)
GFR: 70.94 mL/min (ref >60.00)
Glucose, Bld: 96 mg/dL (ref 70–99)
Potassium: 5.5 mEq/L — ABNORMAL HIGH (ref 3.5–5.1)
Sodium: 139 mEq/L (ref 135–145)
Total Bilirubin: 0.9 mg/dL (ref 0.2–1.2)
Total Protein: 6.7 g/dL (ref 6.0–8.3)

## 2017-11-16 LAB — LIPID PANEL
CHOL/HDL RATIO: 3
CHOLESTEROL: 226 mg/dL — AB (ref 0–200)
HDL: 74.3 mg/dL (ref >39.00)
LDL CALC: 137 mg/dL — AB (ref 0–99)
NonHDL: 152.04
Triglycerides: 75 mg/dL (ref 0.0–149.0)
VLDL: 15 mg/dL (ref 0.0–40.0)

## 2017-11-16 LAB — TSH: TSH: 3.31 u[IU]/mL (ref 0.35–4.50)

## 2017-11-21 ENCOUNTER — Telehealth: Payer: Self-pay

## 2017-11-21 NOTE — Telephone Encounter (Signed)
Pt need appointment in 6 months for CPE

## 2017-11-21 NOTE — Telephone Encounter (Signed)
-----   Message from Donato SchultzYvonne R Lowne Chase, DO sent at 11/16/2017  4:27 PM EDT ----- Cholesterol--- LDL goal < 100,  HDL >40,  TG < 150.  Diet and exercise will increase HDL and decrease LDL and TG.  Fish,  Fish Oil, Flaxseed oil will also help increase the HDL and decrease Triglycerides.   Recheck labs in 6 months with annual .

## 2017-11-21 NOTE — Telephone Encounter (Signed)
Pt scheduled 04/23/2018 @ 8:45am

## 2017-12-31 DIAGNOSIS — M25561 Pain in right knee: Secondary | ICD-10-CM | POA: Diagnosis not present

## 2017-12-31 DIAGNOSIS — M1711 Unilateral primary osteoarthritis, right knee: Secondary | ICD-10-CM | POA: Diagnosis not present

## 2018-01-23 ENCOUNTER — Other Ambulatory Visit: Payer: Self-pay | Admitting: Specialist

## 2018-01-23 DIAGNOSIS — M2391 Unspecified internal derangement of right knee: Secondary | ICD-10-CM

## 2018-01-30 ENCOUNTER — Ambulatory Visit
Admission: RE | Admit: 2018-01-30 | Discharge: 2018-01-30 | Disposition: A | Discharge status: Home / Self Care | Payer: Federal, State, Local not specified - PPO | Source: Ambulatory Visit | Attending: Specialist | Admitting: Specialist

## 2018-01-30 DIAGNOSIS — M25561 Pain in right knee: Secondary | ICD-10-CM | POA: Diagnosis not present

## 2018-01-30 DIAGNOSIS — M2391 Unspecified internal derangement of right knee: Secondary | ICD-10-CM

## 2018-01-31 ENCOUNTER — Other Ambulatory Visit: Payer: Federal, State, Local not specified - PPO

## 2018-02-20 DIAGNOSIS — M1711 Unilateral primary osteoarthritis, right knee: Secondary | ICD-10-CM | POA: Diagnosis not present

## 2018-02-20 DIAGNOSIS — S83241A Other tear of medial meniscus, current injury, right knee, initial encounter: Secondary | ICD-10-CM | POA: Diagnosis not present

## 2018-02-25 ENCOUNTER — Ambulatory Visit: Payer: Self-pay | Admitting: Orthopedic Surgery

## 2018-03-12 ENCOUNTER — Other Ambulatory Visit: Payer: Self-pay

## 2018-03-12 ENCOUNTER — Encounter (HOSPITAL_BASED_OUTPATIENT_CLINIC_OR_DEPARTMENT_OTHER): Payer: Self-pay | Admitting: *Deleted

## 2018-03-12 NOTE — H&P (View-Only) (Signed)
SPOKE WITH Deklyn NPO AFTER MIDNIGHT, ARRIVE 1045 03-21-18 [facility] NO MEDS TO TAKE DRIVER SPOUSE KEITH WILL STAY FOR SURGERY NO LABS NEEDED HAS SURGERY ORDERS IN EPIC 

## 2018-03-12 NOTE — Progress Notes (Signed)
SPOKE WITH Christy Morales AFTER MIDNIGHT, ARRIVE 1045 03-21-18 [facility] NO MEDS TO TAKE DRIVER SPOUSE KEITH WILL STAY FOR SURGERY NO LABS NEEDED HAS SURGERY ORDERS IN EPIC

## 2018-03-21 ENCOUNTER — Ambulatory Visit (HOSPITAL_BASED_OUTPATIENT_CLINIC_OR_DEPARTMENT_OTHER): Payer: Federal, State, Local not specified - PPO | Admitting: Anesthesiology

## 2018-03-21 ENCOUNTER — Ambulatory Visit (HOSPITAL_BASED_OUTPATIENT_CLINIC_OR_DEPARTMENT_OTHER)
Admission: RE | Admit: 2018-03-21 | Discharge: 2018-03-21 | Disposition: A | Discharge status: Home / Self Care | Payer: Federal, State, Local not specified - PPO | Source: Ambulatory Visit | Attending: Specialist | Admitting: Specialist

## 2018-03-21 ENCOUNTER — Encounter (HOSPITAL_BASED_OUTPATIENT_CLINIC_OR_DEPARTMENT_OTHER)
Admission: RE | Disposition: A | Discharge status: Home / Self Care | Payer: Self-pay | Source: Ambulatory Visit | Attending: Specialist

## 2018-03-21 ENCOUNTER — Ambulatory Visit (HOSPITAL_COMMUNITY): Payer: Federal, State, Local not specified - PPO

## 2018-03-21 ENCOUNTER — Encounter (HOSPITAL_BASED_OUTPATIENT_CLINIC_OR_DEPARTMENT_OTHER): Payer: Self-pay | Admitting: Anesthesiology

## 2018-03-21 DIAGNOSIS — M94261 Chondromalacia, right knee: Secondary | ICD-10-CM | POA: Diagnosis not present

## 2018-03-21 DIAGNOSIS — S72431A Displaced fracture of medial condyle of right femur, initial encounter for closed fracture: Secondary | ICD-10-CM | POA: Diagnosis not present

## 2018-03-21 DIAGNOSIS — M8580 Other specified disorders of bone density and structure, unspecified site: Secondary | ICD-10-CM | POA: Diagnosis not present

## 2018-03-21 DIAGNOSIS — M1711 Unilateral primary osteoarthritis, right knee: Secondary | ICD-10-CM | POA: Diagnosis not present

## 2018-03-21 DIAGNOSIS — Z881 Allergy status to other antibiotic agents status: Secondary | ICD-10-CM | POA: Diagnosis not present

## 2018-03-21 DIAGNOSIS — Z880 Allergy status to penicillin: Secondary | ICD-10-CM | POA: Diagnosis not present

## 2018-03-21 DIAGNOSIS — Z9889 Other specified postprocedural states: Secondary | ICD-10-CM

## 2018-03-21 DIAGNOSIS — S83241A Other tear of medial meniscus, current injury, right knee, initial encounter: Secondary | ICD-10-CM | POA: Diagnosis not present

## 2018-03-21 DIAGNOSIS — M84451A Pathological fracture, right femur, initial encounter for fracture: Secondary | ICD-10-CM | POA: Diagnosis not present

## 2018-03-21 DIAGNOSIS — Z79899 Other long term (current) drug therapy: Secondary | ICD-10-CM | POA: Diagnosis not present

## 2018-03-21 DIAGNOSIS — X58XXXA Exposure to other specified factors, initial encounter: Secondary | ICD-10-CM | POA: Insufficient documentation

## 2018-03-21 DIAGNOSIS — G8918 Other acute postprocedural pain: Secondary | ICD-10-CM | POA: Diagnosis not present

## 2018-03-21 HISTORY — PX: KNEE ARTHROSCOPY WITH MEDIAL MENISECTOMY: SHX5651

## 2018-03-21 HISTORY — DX: Other specified postprocedural states: Z98.890

## 2018-03-21 HISTORY — DX: Other complications of anesthesia, initial encounter: T88.59XA

## 2018-03-21 HISTORY — DX: Adverse effect of unspecified anesthetic, initial encounter: T41.45XA

## 2018-03-21 HISTORY — DX: Nausea with vomiting, unspecified: R11.2

## 2018-03-21 HISTORY — DX: Other specified disorders of bone density and structure, unspecified site: M85.80

## 2018-03-21 SURGERY — ARTHROSCOPY, KNEE, WITH MEDIAL MENISCECTOMY
Anesthesia: Regional | Site: Knee | Laterality: Right

## 2018-03-21 MED ORDER — MORPHINE SULFATE (PF) 4 MG/ML IV SOLN
INTRAVENOUS | Status: AC
  Filled 2018-03-21: qty 1

## 2018-03-21 MED ORDER — MIDAZOLAM HCL 2 MG/2ML IJ SOLN
INTRAMUSCULAR | Status: DC | PRN
  Administered 2018-03-21: 2 mg via INTRAVENOUS

## 2018-03-21 MED ORDER — ONDANSETRON HCL 4 MG PO TABS: 4.0000 mg | ORAL_TABLET | Freq: Every day | ORAL | 1 refills | Status: DC | PRN

## 2018-03-21 MED ORDER — VANCOMYCIN HCL IN DEXTROSE 1-5 GM/200ML-% IV SOLN
1000.0000 mg | INTRAVENOUS | Status: AC
  Administered 2018-03-21: 1000 mg via INTRAVENOUS
  Filled 2018-03-21: qty 200

## 2018-03-21 MED ORDER — FENTANYL CITRATE (PF) 100 MCG/2ML IJ SOLN
25.0000 ug | INTRAMUSCULAR | Status: DC | PRN
  Administered 2018-03-21: 50 ug via INTRAVENOUS
  Filled 2018-03-21: qty 1

## 2018-03-21 MED ORDER — OXYCODONE HCL 5 MG PO TABS
ORAL_TABLET | ORAL | Status: AC
  Filled 2018-03-21: qty 1

## 2018-03-21 MED ORDER — SODIUM CHLORIDE 0.9 % IR SOLN
Status: DC | PRN
  Administered 2018-03-21: 6000 mL

## 2018-03-21 MED ORDER — FENTANYL CITRATE (PF) 100 MCG/2ML IJ SOLN
100.0000 ug | Freq: Once | INTRAMUSCULAR | Status: DC
  Filled 2018-03-21: qty 2

## 2018-03-21 MED ORDER — PROPOFOL 10 MG/ML IV BOLUS
INTRAVENOUS | Status: DC | PRN
  Administered 2018-03-21: 100 mg via INTRAVENOUS

## 2018-03-21 MED ORDER — PROPOFOL 500 MG/50ML IV EMUL
INTRAVENOUS | Status: AC
  Filled 2018-03-21: qty 50

## 2018-03-21 MED ORDER — OXYCODONE HCL 5 MG PO TABS: 5.0000 mg | ORAL_TABLET | ORAL | 0 refills | Status: DC | PRN

## 2018-03-21 MED ORDER — LACTATED RINGERS IV SOLN
INTRAVENOUS | Status: DC
  Administered 2018-03-21: 11:00:00 via INTRAVENOUS
  Filled 2018-03-21: qty 1000

## 2018-03-21 MED ORDER — MIDAZOLAM HCL 2 MG/2ML IJ SOLN
INTRAMUSCULAR | Status: AC
  Filled 2018-03-21: qty 2

## 2018-03-21 MED ORDER — LIDOCAINE 2% (20 MG/ML) 5 ML SYRINGE
INTRAMUSCULAR | Status: AC
  Filled 2018-03-21: qty 5

## 2018-03-21 MED ORDER — DEXAMETHASONE SODIUM PHOSPHATE 10 MG/ML IJ SOLN
INTRAMUSCULAR | Status: AC
  Filled 2018-03-21: qty 1

## 2018-03-21 MED ORDER — VANCOMYCIN HCL 500 MG IV SOLR
INTRAVENOUS | Status: AC
  Filled 2018-03-21: qty 500

## 2018-03-21 MED ORDER — WHITE PETROLATUM EX OINT
TOPICAL_OINTMENT | CUTANEOUS | Status: AC
  Filled 2018-03-21: qty 5

## 2018-03-21 MED ORDER — FENTANYL CITRATE (PF) 100 MCG/2ML IJ SOLN
INTRAMUSCULAR | Status: AC
  Filled 2018-03-21: qty 2

## 2018-03-21 MED ORDER — DOXYCYCLINE HYCLATE 50 MG PO CAPS: 50.0000 mg | ORAL_CAPSULE | Freq: Two times a day (BID) | ORAL | 0 refills | Status: DC

## 2018-03-21 MED ORDER — BUPIVACAINE HCL 0.25 % IJ SOLN
INTRAMUSCULAR | Status: DC | PRN
  Administered 2018-03-21: 30 mL

## 2018-03-21 MED ORDER — MIDAZOLAM HCL 2 MG/2ML IJ SOLN
2.0000 mg | Freq: Once | INTRAMUSCULAR | Status: DC
  Filled 2018-03-21: qty 2

## 2018-03-21 MED ORDER — BUPIVACAINE-EPINEPHRINE (PF) 0.25% -1:200000 IJ SOLN
INTRAMUSCULAR | Status: DC | PRN
  Administered 2018-03-21: 30 mL

## 2018-03-21 MED ORDER — DEXAMETHASONE SODIUM PHOSPHATE 4 MG/ML IJ SOLN
INTRAMUSCULAR | Status: DC | PRN
  Administered 2018-03-21: 5 mg via INTRAVENOUS

## 2018-03-21 MED ORDER — ACETAMINOPHEN 10 MG/ML IV SOLN
INTRAVENOUS | Status: AC
  Filled 2018-03-21: qty 100

## 2018-03-21 MED ORDER — LIDOCAINE 2% (20 MG/ML) 5 ML SYRINGE
INTRAMUSCULAR | Status: DC | PRN
  Administered 2018-03-21: 100 mg via INTRAVENOUS

## 2018-03-21 MED ORDER — ONDANSETRON HCL 4 MG/2ML IJ SOLN
INTRAMUSCULAR | Status: DC | PRN
  Administered 2018-03-21: 4 mg via INTRAVENOUS

## 2018-03-21 MED ORDER — METHOCARBAMOL 500 MG PO TABS: 500.0000 mg | ORAL_TABLET | Freq: Three times a day (TID) | ORAL | 1 refills | Status: DC | PRN

## 2018-03-21 MED ORDER — CHLORHEXIDINE GLUCONATE 4 % EX LIQD
60.0000 mL | Freq: Once | CUTANEOUS | Status: DC
  Filled 2018-03-21: qty 118

## 2018-03-21 MED ORDER — PROPOFOL 500 MG/50ML IV EMUL
INTRAVENOUS | Status: DC | PRN
  Administered 2018-03-21: 200 ug/kg/min via INTRAVENOUS

## 2018-03-21 MED ORDER — VANCOMYCIN HCL IN DEXTROSE 1-5 GM/200ML-% IV SOLN
INTRAVENOUS | Status: AC
  Filled 2018-03-21: qty 200

## 2018-03-21 MED ORDER — MORPHINE SULFATE (PF) 4 MG/ML IV SOLN
INTRAVENOUS | Status: DC | PRN
  Administered 2018-03-21: 4 mg via INTRAVENOUS

## 2018-03-21 MED ORDER — PROPOFOL 10 MG/ML IV BOLUS
INTRAVENOUS | Status: AC
  Filled 2018-03-21: qty 20

## 2018-03-21 MED ORDER — ONDANSETRON HCL 4 MG/2ML IJ SOLN
INTRAMUSCULAR | Status: AC
  Filled 2018-03-21: qty 2

## 2018-03-21 MED ORDER — ACETAMINOPHEN 10 MG/ML IV SOLN
1000.0000 mg | Freq: Four times a day (QID) | INTRAVENOUS | Status: DC
  Administered 2018-03-21: 1000 mg via INTRAVENOUS
  Filled 2018-03-21: qty 100

## 2018-03-21 MED ORDER — OXYCODONE HCL 5 MG PO TABS
5.0000 mg | ORAL_TABLET | Freq: Once | ORAL | Status: AC
  Administered 2018-03-21: 5 mg via ORAL
  Filled 2018-03-21: qty 1

## 2018-03-21 MED ORDER — ASPIRIN EC 325 MG PO TBEC: 325.0000 mg | DELAYED_RELEASE_TABLET | Freq: Two times a day (BID) | ORAL | 0 refills | Status: DC

## 2018-03-21 MED ORDER — FENTANYL CITRATE (PF) 100 MCG/2ML IJ SOLN
INTRAMUSCULAR | Status: DC | PRN
  Administered 2018-03-21: 50 ug via INTRAVENOUS
  Administered 2018-03-21: 100 ug via INTRAVENOUS

## 2018-03-21 MED FILL — DOXYCYCLINE HYC 50 MG CAP: 50 | 5 days supply | Qty: 10 | Fill #0

## 2018-03-21 MED FILL — oxyCODONE HCL 5 MG TABS: 5 | 6 days supply | Qty: 40 | Fill #0

## 2018-03-21 MED FILL — METHOCARBAMOL 500 MG TABLET: 500 | 16 days supply | Qty: 50 | Fill #0

## 2018-03-21 MED FILL — ONDANSETRON HCL 4 MG TABLET: 4 | 13 days supply | Qty: 13 | Fill #0

## 2018-03-21 SURGICAL SUPPLY — 48 items
BANDAGE ESMARK 6X9 LF (GAUZE/BANDAGES/DRESSINGS) ×1 IMPLANT
BLADE CUDA GRT WHITE 3.5 (BLADE) ×2 IMPLANT
BNDG CMPR 9X6 STRL LF SNTH (GAUZE/BANDAGES/DRESSINGS) ×1
BNDG ESMARK 6X9 LF (GAUZE/BANDAGES/DRESSINGS) ×2
BNDG GAUZE ELAST 4 BULKY (GAUZE/BANDAGES/DRESSINGS) ×2 IMPLANT
COVER WAND RF STERILE (DRAPES) ×2 IMPLANT
DRAPE ARTHROSCOPY W/POUCH 114 (DRAPES) ×2 IMPLANT
DRAPE C-ARM 42X72 X-RAY (DRAPES) ×2 IMPLANT
DRAPE INCISE IOBAN 66X45 STRL (DRAPES) ×2 IMPLANT
DRAPE SHEET LG 3/4 BI-LAMINATE (DRAPES) ×2 IMPLANT
DRAPE U-SHAPE 47X51 STRL (DRAPES) ×2 IMPLANT
DURAPREP 26ML APPLICATOR (WOUND CARE) ×2 IMPLANT
ELECT MENISCUS 165MM 90D (ELECTRODE) IMPLANT
GAUZE SPONGE 4X4 12PLY STRL (GAUZE/BANDAGES/DRESSINGS) ×2 IMPLANT
GAUZE XEROFORM 1X8 LF (GAUZE/BANDAGES/DRESSINGS) ×2 IMPLANT
GLOVE BIO SURGEON STRL SZ 6.5 (GLOVE) ×1 IMPLANT
GLOVE BIO SURGEON STRL SZ7 (GLOVE) ×1 IMPLANT
GLOVE BIO SURGEON STRL SZ7.5 (GLOVE) ×2 IMPLANT
GLOVE BIO SURGEON STRL SZ8 (GLOVE) ×2 IMPLANT
GLOVE BIOGEL PI IND STRL 6.5 (GLOVE) IMPLANT
GLOVE BIOGEL PI IND STRL 7.0 (GLOVE) IMPLANT
GLOVE BIOGEL PI IND STRL 7.5 (GLOVE) IMPLANT
GLOVE BIOGEL PI INDICATOR 6.5 (GLOVE) ×1
GLOVE BIOGEL PI INDICATOR 7.0 (GLOVE) ×1
GLOVE BIOGEL PI INDICATOR 7.5 (GLOVE) ×1
GLOVE INDICATOR 8.0 STRL GRN (GLOVE) ×4 IMPLANT
GOWN STRL REUS W/TWL LRG LVL3 (GOWN DISPOSABLE) ×2 IMPLANT
GOWN STRL REUS W/TWL XL LVL3 (GOWN DISPOSABLE) ×4 IMPLANT
IV NS IRRIG 3000ML ARTHROMATIC (IV SOLUTION) ×4 IMPLANT
KIT MIXER ACCUMIX (KITS) ×1 IMPLANT
KIT TURNOVER CYSTO (KITS) ×2 IMPLANT
KNEE KIT SCP W/SIDE ACCUPORT (Joint) ×1 IMPLANT
KNEE WRAP E Z 3 GEL PACK (MISCELLANEOUS) ×2 IMPLANT
MANIFOLD NEPTUNE II (INSTRUMENTS) ×2 IMPLANT
NEEDLE HYPO 22GX1.5 SAFETY (NEEDLE) IMPLANT
PACK ARTHROSCOPY DSU (CUSTOM PROCEDURE TRAY) ×2 IMPLANT
PACK BASIN DAY SURGERY FS (CUSTOM PROCEDURE TRAY) ×2 IMPLANT
PAD ABD 8X10 STRL (GAUZE/BANDAGES/DRESSINGS) ×2 IMPLANT
PAD ARMBOARD 7.5X6 YLW CONV (MISCELLANEOUS) IMPLANT
PROBE BIPOLAR 50 DEGREE SUCT (MISCELLANEOUS) ×1 IMPLANT
SET ARTHROSCOPY TUBING (MISCELLANEOUS) ×2
SET ARTHROSCOPY TUBING LN (MISCELLANEOUS) ×1 IMPLANT
SUT ETHILON 4 0 PS 2 18 (SUTURE) ×2 IMPLANT
SYR CONTROL 10ML LL (SYRINGE) ×2 IMPLANT
TOWEL OR 17X24 6PK STRL BLUE (TOWEL DISPOSABLE) ×2 IMPLANT
TUBE CONNECTING 12X1/4 (SUCTIONS) IMPLANT
WATER STERILE IRR 500ML POUR (IV SOLUTION) ×2 IMPLANT
WRAP KNEE MAXI GEL POST OP (GAUZE/BANDAGES/DRESSINGS) ×1 IMPLANT

## 2018-03-21 NOTE — H&P (Signed)
Christy Morales is an 57 y.o. female.   Chief Complaint: Right knee pain HPI: Patient presents with joint discomfort that had been persistent for several weeks now. Despite conservative treatments, the patients discomfort has not improved. Imaging was obtained. Other conservative and surgical treatments were discussed in detail. Patient wishes to proceed with surgery as consented. Denies SOB, CP, or calf pain. No Fever, chills, or nausea/ vomiting.   Past Medical History:  Diagnosis Date  . Complication of anesthesia   . DJD (degenerative joint disease)    RIGHT KNEE  . Family history of breast cancer   . History of shingles   . Osteopenia    LOWER SPINE AND RIGHT HIP  . Paroxysmal supraventricular tachycardia (HCC)    YRS AGO, NO CAUSE FOUND   . PONV (postoperative nausea and vomiting)     Past Surgical History:  Procedure Laterality Date  . COLONSCOPY    . right knee arthroscopy for meniscus tear     Dr. Rennis ChrisSupple  . tubal pregnancy with D & C      Family History  Problem Relation Age of Onset  . High Cholesterol Father   . Heart disease Father   . Esophageal cancer Father [id number]       smoker and heavy drinker  . Stomach cancer Father [id number]       smoker and heavy drinker  . Breast cancer Mother [id number]  . High Cholesterol Mother   . Heart disease Mother   . Heart attack Mother        d. 6672y  . Breast cancer Sister 4056       d. 59y; took fertility hormones  . Other Sister        hx cervical ablation  . Osteoporosis Maternal Grandmother   . Osteoporosis Sister   . Hyperparathyroidism Sister        adenoma  . Cancer Maternal Grandfather        oral cancer dx 5980s; smoker and SL tob user  . Pneumonia Paternal Grandmother        d. 30s  . Aneurysm Paternal Grandfather        brain; d. 7570s   Social History:  reports that she has never smoked. She has never used smokeless tobacco. She reports that she drinks about 2.0 standard drinks of alcohol per week. She reports that she does not  use drugs.  Allergies:  Allergies  Allergen Reactions  . Penicillins     GI UPSET, DONE OK WITH PENCILLIAN RECENTLY  . Sulfamethoxazole-Trimethoprim     REACTION: rash SEVERE BACTRIM    Medications Prior to Admission  Medication Sig Dispense Refill  . calcium citrate-vitamin D (CITRACAL+D) 315-200 MG-UNIT per tablet Take 2 tablets by mouth 2 (two) times daily. CALCIUM 630 PER TAB AND VIT D 500 UNITS PER 2 CAPSULES    . ibuprofen (ADVIL,MOTRIN) 200 MG tablet Take 200 mg by mouth every 6 (six) hours as needed. TAKES 2 TABS PRN      No results found for this or any previous visit (from the past 48 hour(s)). No results found.  Review of Systems  Constitutional: Negative.   HENT: Negative.   Eyes: Negative.   Respiratory: Negative.   Cardiovascular: Negative.   Gastrointestinal: Negative.   Genitourinary: Negative.   Musculoskeletal: Positive for joint pain.  Skin: Negative.   Neurological: Negative.   Endo/Heme/Allergies: Negative.   Psychiatric/Behavioral: Negative.     Blood pressure 104/70, pulse (!) 59, temperature 98.2 F (36.8  C), temperature source Oral, resp. rate 16, height 5\' 5"  (1.651 m), weight 70 kg, SpO2 100 %. Physical Exam  Constitutional: She is oriented to person, place, and time. She appears well-developed.  HENT:  Head: Normocephalic.  Eyes: EOM are normal.  Cardiovascular: Normal rate and intact distal pulses.  Respiratory: Effort normal.  GI: Soft.  Genitourinary:  Genitourinary Comments: Deferred  Musculoskeletal:  Limited strength. Joint line tenderness. Calf is soft. Plantar and dorsi flexion intact.    Neurological: She is alert and oriented to person, place, and time.  Skin: Skin is warm and dry.  Psychiatric: Her behavior is normal.     Assessment/Plan Right knee TM and IF: Right knee scope as consented D/c home with family F/u in 7 days ASA BId Franky Reier L, PA-C 03/21/2018, 11:28 AM

## 2018-03-21 NOTE — Anesthesia Procedure Notes (Signed)
Procedure Name: LMA Insertion Date/Time: 03/21/2018 12:02 PM Performed by: Tyrone NineSauve, Danaiya Steadman F, CRNA Pre-anesthesia Checklist: Patient identified, Emergency Drugs available, Suction available, Patient being monitored and Timeout performed Patient Re-evaluated:Patient Re-evaluated prior to induction Oxygen Delivery Method: Circle system utilized Preoxygenation: Pre-oxygenation with 100% oxygen Induction Type: IV induction Ventilation: Mask ventilation without difficulty LMA: LMA inserted LMA Size: 4.0 Number of attempts: 1 Placement Confirmation: breath sounds checked- equal and bilateral,  CO2 detector and positive ETCO2 Tube secured with: Tape Dental Injury: Teeth and Oropharynx as per pre-operative assessment

## 2018-03-21 NOTE — Op Note (Signed)
Dictated#[id number]

## 2018-03-21 NOTE — Anesthesia Procedure Notes (Signed)
Anesthesia Regional Block: Knee block   Pre-Anesthetic Checklist: ,, timeout performed, Correct Patient, Correct Site, Correct Laterality, Correct Procedure, Correct Position, site marked, Risks and benefits discussed,  Surgical consent,  Pre-op evaluation,  At surgeon's request and post-op pain management  Laterality: Right and Lower  Prep: chloraprep       Needles:   Needle Type: Quincke     Needle Length: 4cm  Needle Gauge: 18     Additional Needles:   Narrative:  Start time: 03/21/2018 11:35 AM End time: 03/21/2018 11:43 AM Injection made incrementally with aspirations every 5 mL.  Performed by: Personally  Anesthesiologist: Jairo BenJackson, Jaquala Fuller, MD  Additional Notes: Pt identified in Holding room.  Monitors applied. Working IV access confirmed. Sterile prep R patella.  1% lido local. #18 quincke into synovial fluid, with some difficulty, at R upper lateral patella, and R medial inferior patella.  30cc 0.5% Bupivacaine with 1:200k epi injected incrementally after negative test dose.  Patient asymptomatic, VSS, no heme aspirated, tolerated well.  Sandford Craze Christy Yung, MD

## 2018-03-21 NOTE — Op Note (Signed)
[name], Christy Morales MEDICAL RECORD [id number] ACCOUNT [id number] DATE OF BIRTH:05-Jun-1960 FACILITY: [facility] LOCATION: [facility] PHYSICIAN:Cristian Grieves Gwinda Passe, MD  OPERATIVE REPORT  DATE OF PROCEDURE:  03/21/2018  PREOPERATIVE DIAGNOSES: 1.  Right knee torn meniscus. 2.  Osteoarthritis. 3.  Right medial femoral condyle subchondral insufficiency fracture.  POSTOPERATIVE DIAGNOSES:   1.  Right knee torn meniscus. 2.  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  PROCEDURE: 1.  Right knee arthroscopic partial medial meniscectomy, chondroplasty medial femoral condyle. 2.  Arthroscopic assisted internal fixation of medial femoral condyle insufficiency fracture.  SURGEON:  Cynda Familia, MD  ASSISTANT:  Wyatt Portela, PA-C.  ANESTHESIA:  Knee block with general.  ESTIMATED BLOOD LOSS:  Minimal.  DRAINS:  None.  COMPLICATIONS:  None.  TOURNIQUET TIME:  30 minutes at 250 mmHg.    DISPOSITION:  PACU stable.  DESCRIPTION OF PROCEDURE:  The patient was counseled in the holding area, correct site was identified, marked and signed appropriately.  IV started.  Sedation was given.  Block was administered.  TED hose applied to involved leg.  IV antibiotics were  given.  She was then taken to the operating room and placed in supine position, general anesthesia.  Right lower extremity was elevated, prepped with DuraPrep and draped in sterile fashion.  Time out done identifying the right side.  Exsanguinated with  Esmarch tourniquet inflated to 300 mmHg.  Arthroscopic portals were established proximal inferomedial, inferolateral diagnostic arthroscopy of the suprapatellar pouch, medial gutters unremarkable.  Patellofemoral joint minimal chondromalacia not  requiring chondroplasty.  ACL and PCL were intact.  Lateral side inspected.  Lateral meniscus was intact as was the articular cartilage.  Medial side was inspected.  Medial meniscus showed a recurrent tear  of the posterior horn of the medial meniscus  debridement a shaver basket and smoothed down with the cautery system.  She also has some early grade III chondromalacia and femoral condyle.  A light chondroplasty was shaved back to stable base.  The C-arm was then brought in.  I then localized the area of the medial femoral condyle insufficiency fracture in both AP and lateral planes and coordinated with the MRI scan which is available.  The [employer] was utilized.  The trocar was tapped  into the anatomic position of the insufficiency fracture confirmed on AP and lateral planes and I then infused AccuFill to the correct amount of pressure.  We confirmed there was no extravasation.  Scope was rescoped and there was no extravasation of  the AccuFill to the knee joint and no complicating features.  After the AccuFill was set up, the trocar was removed.  Portal closed with 1 suture.  Then, 10 mL Sensorcaine placed in the skin.  Sterile dressing applied, ice pack, TED hose.  She is  awakened and taken to the PACU in stable condition.  She will be stabilized in PACU discharged home.  Return to my office in a few days.  Short course of physical therapy.  Prognosis is very good.  I would also recommend a lateral heel wedge on her first  visit to the office.  Wyatt Portela, PA-C, helped with patient positioning, prepping and draping, technical and surgical assistant throughout the entire case, wound closure, application of AccuFill, assistance of image reading.  Mr. Jerilynn Mages, PA-C's, assistant was  needed today.  TN/NUANCE  D:03/21/2018 T:03/21/2018 JOB:[id number]

## 2018-03-21 NOTE — Discharge Instructions (Signed)
Regional Anesthesia Blocks  1. Numbness or the inability to move the "blocked" extremity may last from 3-48 hours after placement. The length of time depends on the medication injected and your individual response to the medication. If the numbness is not going away after 48 hours, call your surgeon.  2. The extremity that is blocked will need to be protected until the numbness is gone and the  Strength has returned. Because you cannot feel it, you will need to take extra care to avoid injury. Because it may be weak, you may have difficulty moving it or using it. You may not know what position it is in without looking at it while the block is in effect.  3. For blocks in the legs and feet, returning to weight bearing and walking needs to be done carefully. You will need to wait until the numbness is entirely gone and the strength has returned. You should be able to move your leg and foot normally before you try and bear weight or walk. You will need someone to be with you when you first try to ensure you do not fall and possibly risk injury.  4. Bruising and tenderness at the needle site are common side effects and will resolve in a few days.  5. Persistent numbness or new problems with movement should be communicated to the surgeon or the [facility] [place] [phone]).  Post Anesthesia Home Care Instructions  Activity: Get plenty of rest for the remainder of the day. A responsible individual must stay with you for 24 hours following the procedure.  For the next 24 hours, DO NOT: -Drive a car -Advertising copywriterperate machinery -Drink alcoholic beverages -Take any medication unless instructed by your physician -Make any legal decisions or sign important papers.  Meals: Start with liquid foods such as gelatin or soup. Progress to regular foods as tolerated. Avoid greasy, spicy, heavy foods. If nausea and/or vomiting occur, drink only clear liquids until the nausea and/or vomiting subsides. Call your  physician if vomiting continues.  Special Instructions/Symptoms: Your throat may feel dry or sore from the anesthesia or the breathing tube placed in your throat during surgery. If this causes discomfort, gargle with warm salt water. The discomfort should disappear within 24 hours.  If you had a scopolamine patch placed behind your ear for the management of post- operative nausea and/or vomiting:  1. The medication in the patch is effective for 72 hours, after which it should be removed.  Wrap patch in a tissue and discard in the trash. Wash hands thoroughly with soap and water. 2. You may remove the patch earlier than 72 hours if you experience unpleasant side effects which may include dry mouth, dizziness or visual disturbances. 3. Avoid touching the patch. Wash your hands with soap and water after contact with the patch.

## 2018-03-21 NOTE — Interval H&P Note (Signed)
History and Physical Interval Note:  03/21/2018 11:39 AM  Christy Morales  has presented today for surgery, with the diagnosis of Right knee torn medial meniscus, osteoarthritis, insufficiency fracture medial femoral condyle  The various methods of treatment have been discussed with the patient and family. After consideration of risks, benefits and other options for treatment, the patient has consented to  Procedure(s): RIGHT KNEE ARTHROSCOPY WITH PARTIAL MEDIAL MENISECTOMY, CHONDROPLASTY, ARTHROSCOPIC INTERNAL FIXATION MEDIAL FEMORAL CONDYLE (Right) as a surgical intervention .  The patient's history has been reviewed, patient examined, no change in status, stable for surgery.  I have reviewed the patient's chart and labs.  Questions were answered to the patient's satisfaction.     [name] ANDREW

## 2018-03-21 NOTE — Transfer of Care (Signed)
Immediate Anesthesia Transfer of Care Note  Patient: Christy Morales  Procedure(s) Performed: RIGHT KNEE ARTHROSCOPY WITH PARTIAL MEDIAL MENISECTOMY, CHONDROPLASTY, ARTHROSCOPIC INTERNAL FIXATION MEDIAL FEMORAL CONDYLE (Right Knee)  Patient Location: PACU  Anesthesia Type:General  Level of Consciousness: awake, alert , oriented and patient cooperative  Airway & Oxygen Therapy: Patient Spontanous Breathing and Patient connected to nasal cannula oxygen  Post-op Assessment: Report given to RN and Post -op Vital signs reviewed and stable  Post vital signs: Reviewed and stable  Last Vitals:  Vitals Value Taken Time  BP    Temp    Pulse 47 03/21/2018  1:03 PM  Resp 9 03/21/2018  1:03 PM  SpO2 100 % 03/21/2018  1:03 PM  Vitals shown include unvalidated device data.  Last Pain:  Vitals:   03/21/18 1039  TempSrc: Oral         Complications: No apparent anesthesia complications

## 2018-03-21 NOTE — Anesthesia Preprocedure Evaluation (Addendum)
Anesthesia Evaluation  Patient identified by MRN, date of birth, ID band Patient awake    Reviewed: Allergy & Precautions, NPO status , Patient's Chart, lab work & pertinent test results  History of Anesthesia Complications (+) PONV and history of anesthetic complications  Airway Mallampati: I  TM Distance: >3 FB Neck ROM: Full    Dental no notable dental hx. (+) Teeth Intact, Dental Advisory Given   Pulmonary neg pulmonary ROS,    Pulmonary exam normal breath sounds clear to auscultation       Cardiovascular Normal cardiovascular exam+ dysrhythmias Supra Ventricular Tachycardia  Rhythm:Regular Rate:Normal     Neuro/Psych negative neurological ROS  negative psych ROS   GI/Hepatic negative GI ROS, Neg liver ROS,   Endo/Other  negative endocrine ROS  Renal/GU negative Renal ROS  negative genitourinary   Musculoskeletal  (+) Arthritis , Osteoarthritis,    Abdominal   Peds  Hematology negative hematology ROS (+)   Anesthesia Other Findings   Reproductive/Obstetrics                            Anesthesia Physical Anesthesia Plan  ASA: II  Anesthesia Plan: General and Regional   Post-op Pain Management:  Regional for Post-op pain   Induction: Intravenous  PONV Risk Score and Plan: 4 or greater and Ondansetron, Dexamethasone, Midazolam and TIVA  Airway Management Planned: Oral ETT and LMA  Additional Equipment:   Intra-op Plan:   Post-operative Plan: Extubation in OR  Informed Consent: I have reviewed the patients History and Physical, chart, labs and discussed the procedure including the risks, benefits and alternatives for the proposed anesthesia with the patient or authorized representative who has indicated his/her understanding and acceptance.   Dental advisory given  Plan Discussed with: CRNA  Anesthesia Plan Comments:        Anesthesia Quick Evaluation

## 2018-03-22 ENCOUNTER — Encounter (HOSPITAL_BASED_OUTPATIENT_CLINIC_OR_DEPARTMENT_OTHER): Payer: Self-pay | Admitting: Specialist

## 2018-03-22 NOTE — Anesthesia Postprocedure Evaluation (Signed)
Anesthesia Post Note  Patient: Christy Morales  Procedure(s) Performed: RIGHT KNEE ARTHROSCOPY WITH PARTIAL MEDIAL MENISECTOMY, CHONDROPLASTY, ARTHROSCOPIC INTERNAL FIXATION MEDIAL FEMORAL CONDYLE (Right Knee)     Patient location during evaluation: PACU Anesthesia Type: Regional and General Level of consciousness: awake and alert Pain management: pain level controlled Vital Signs Assessment: post-procedure vital signs reviewed and stable Respiratory status: spontaneous breathing, nonlabored ventilation, respiratory function stable and patient connected to nasal cannula oxygen Cardiovascular status: blood pressure returned to baseline and stable Postop Assessment: no apparent nausea or vomiting Anesthetic complications: no    Last Vitals:  Vitals:   03/21/18 1330 03/21/18 1345  BP: (!) 120/59 (!) 143/77  Pulse: (!) 49 (!) 56  Resp: 13 18  Temp:    SpO2: 100% 100%    Last Pain:  Vitals:   03/21/18 1520  TempSrc:   PainSc: 2                  Milo Solana L Alexandros Ewan

## 2018-03-27 DIAGNOSIS — Z4789 Encounter for other orthopedic aftercare: Secondary | ICD-10-CM | POA: Diagnosis not present

## 2018-03-28 DIAGNOSIS — M25561 Pain in right knee: Secondary | ICD-10-CM | POA: Diagnosis not present

## 2018-04-03 DIAGNOSIS — M25561 Pain in right knee: Secondary | ICD-10-CM | POA: Diagnosis not present

## 2018-04-05 DIAGNOSIS — M25561 Pain in right knee: Secondary | ICD-10-CM | POA: Diagnosis not present

## 2018-04-08 DIAGNOSIS — M25561 Pain in right knee: Secondary | ICD-10-CM | POA: Diagnosis not present

## 2018-04-16 DIAGNOSIS — M25561 Pain in right knee: Secondary | ICD-10-CM | POA: Diagnosis not present

## 2018-04-18 DIAGNOSIS — M25561 Pain in right knee: Secondary | ICD-10-CM | POA: Diagnosis not present

## 2018-04-22 DIAGNOSIS — M25561 Pain in right knee: Secondary | ICD-10-CM | POA: Diagnosis not present

## 2018-04-23 ENCOUNTER — Ambulatory Visit (INDEPENDENT_AMBULATORY_CARE_PROVIDER_SITE_OTHER): Payer: Federal, State, Local not specified - PPO | Admitting: Family Medicine

## 2018-04-23 ENCOUNTER — Encounter: Payer: Self-pay | Admitting: Family Medicine

## 2018-04-23 VITALS — BP 110/70 | HR 57 | Temp 98.0°F | Resp 16 | Ht 65.0 in | Wt 156.0 lb

## 2018-04-23 DIAGNOSIS — Z0000 Encounter for general adult medical examination without abnormal findings: Secondary | ICD-10-CM

## 2018-04-23 DIAGNOSIS — M8580 Other specified disorders of bone density and structure, unspecified site: Secondary | ICD-10-CM

## 2018-04-23 LAB — COMPREHENSIVE METABOLIC PANEL
ALBUMIN: 4.7 g/dL (ref 3.5–5.2)
ALT: 6 U/L (ref 0–35)
AST: 13 U/L (ref 0–37)
Alkaline Phosphatase: 55 U/L (ref 39–117)
BUN: 17 mg/dL (ref 6–23)
CHLORIDE: 100 meq/L (ref 96–112)
CO2: 32 mEq/L (ref 19–32)
CREATININE: 0.85 mg/dL (ref 0.40–1.20)
Calcium: 10.5 mg/dL (ref 8.4–10.5)
GFR: 72.76 mL/min (ref >60.00)
Glucose, Bld: 90 mg/dL (ref 70–99)
Potassium: 4.3 mEq/L (ref 3.5–5.1)
Sodium: 139 mEq/L (ref 135–145)
Total Bilirubin: 0.8 mg/dL (ref 0.2–1.2)
Total Protein: 7 g/dL (ref 6.0–8.3)

## 2018-04-23 LAB — CBC WITH DIFFERENTIAL/PLATELET
Basophils Absolute: 0 10*3/uL (ref 0.0–0.1)
Basophils Relative: 0.9 % (ref 0.0–3.0)
Eosinophils Absolute: 0.1 10*3/uL (ref 0.0–0.7)
Eosinophils Relative: 2.1 % (ref 0.0–5.0)
HCT: 43.8 % (ref 36.0–46.0)
Hemoglobin: 14.9 g/dL (ref 12.0–15.0)
LYMPHS PCT: 26.9 % (ref 12.0–46.0)
Lymphs Abs: 1.3 10*3/uL (ref 0.7–4.0)
MCHC: 34 g/dL (ref 30.0–36.0)
MCV: 95.8 fl (ref 78.0–100.0)
MONOS PCT: 9.3 % (ref 3.0–12.0)
Monocytes Absolute: 0.5 10*3/uL (ref 0.1–1.0)
NEUTROS ABS: 3 10*3/uL (ref 1.4–7.7)
Neutrophils Relative %: 60.8 % (ref 43.0–77.0)
Platelets: 213 10*3/uL (ref 150.0–400.0)
RBC: 4.57 Mil/uL (ref 3.87–5.11)
RDW: 12.7 % (ref 11.5–15.5)
WBC: 4.9 10*3/uL (ref 4.0–10.5)

## 2018-04-23 LAB — LIPID PANEL
Cholesterol: 277 mg/dL — ABNORMAL HIGH (ref 0–200)
HDL: 79.3 mg/dL (ref >39.00)
LDL Cholesterol: 181 mg/dL — ABNORMAL HIGH (ref 0–99)
NonHDL: 197.87
TRIGLYCERIDES: 82 mg/dL (ref 0.0–149.0)
Total CHOL/HDL Ratio: 3
VLDL: 16.4 mg/dL (ref 0.0–40.0)

## 2018-04-23 LAB — TSH: TSH: 3.92 u[IU]/mL (ref 0.35–4.50)

## 2018-04-23 NOTE — Progress Notes (Signed)
Subjective:     Christy Morales is a 58 y.o. female and is here for a comprehensive physical exam. The patient reports no problems.  Social History   Socioeconomic History  . Marital status: Married    Spouse name: Dr. Marcelyn Bruins  . Number of children: Not on file  . Years of education: Not on file  . Highest education level: Not on file  Occupational History  . Occupation: nursing with [employer]  Social Needs  . Financial resource strain: Not on file  . Food insecurity:    Worry: Not on file    Inability: Not on file  . Transportation needs:    Medical: Not on file    Non-medical: Not on file  Tobacco Use  . Smoking status: Never Smoker  . Smokeless tobacco: Never Used  Substance and Sexual Activity  . Alcohol use: Yes    Alcohol/week: 2.0 standard drinks    Types: 2 Glasses of wine per week    Comment: OCC WINE  . Drug use: No  . Sexual activity: Yes    Partners: Male  Lifestyle  . Physical activity:    Days per week: Not on file    Minutes per session: Not on file  . Stress: Not on file  Relationships  . Social connections:    Talks on phone: Not on file    Gets together: Not on file    Attends religious service: Not on file    Active member of club or organization: Not on file    Attends meetings of clubs or organizations: Not on file    Relationship status: Not on file  . Intimate partner violence:    Fear of current or ex partner: Not on file    Emotionally abused: Not on file    Physically abused: Not on file    Forced sexual activity: Not on file  Other Topics Concern  . Not on file  Social History Narrative   Exercise twice a week   Health Maintenance  Topic Date Due  . MAMMOGRAM  09/16/2014  . PAP SMEAR-Modifier  09/15/2016  . INFLUENZA VACCINE  11/16/2018 (Originally 11/15/2017)  . TETANUS/TDAP  04/29/2018  . COLONOSCOPY  06/05/2024  . Hepatitis C Screening  Completed  . HIV Screening  Completed    The following portions of the  patient's history were reviewed and updated as appropriate: She  has a past medical history of Complication of anesthesia, DJD (degenerative joint disease), Family history of breast cancer, History of shingles, Osteopenia, Paroxysmal supraventricular tachycardia (HCC), and PONV (postoperative nausea and vomiting). She does not have any pertinent problems on file. She  has a past surgical history that includes tubal pregnancy with D & C; right knee arthroscopy for meniscus tear; COLONSCOPY; and Knee arthroscopy with medial menisectomy (Right, 03/21/2018). Her family history includes Aneurysm in her paternal grandfather; Breast cancer (age of onset: 43) in her sister; Breast cancer (age of onset: 80) in her mother; Cancer in her maternal grandfather; Esophageal cancer (age of onset: 18) in her father; Heart attack in her mother; Heart disease in her father and mother; High Cholesterol in her father and mother; Hyperparathyroidism in her sister; Osteoporosis in her maternal grandmother and sister; Other in her sister; Pneumonia in her paternal grandmother; Stomach cancer (age of onset: 37) in her father. She  reports that she has never smoked. She has never used smokeless tobacco. She reports current alcohol use of about 2.0 standard drinks of  alcohol per week. She reports that she does not use drugs. She has a current medication list which includes the following prescription(s): calcium citrate-vitamin d. Current Outpatient Medications on File Prior to Visit  Medication Sig Dispense Refill  . calcium citrate-vitamin D (CITRACAL+D) 315-200 MG-UNIT per tablet Take 2 tablets by mouth 2 (two) times daily. CALCIUM 630 PER TAB AND VIT D 500 UNITS PER 2 CAPSULES     No current facility-administered medications on file prior to visit.    She is allergic to penicillins and sulfamethoxazole-trimethoprim..  Review of Systems Review of Systems  Constitutional: Negative for activity change, appetite change and  fatigue.  HENT: Negative for hearing loss, congestion, tinnitus and ear discharge.  dentist q60m Eyes: Negative for visual disturbance (see optho q1y -- vision corrected to 20/20 with glasses).  Respiratory: Negative for cough, chest tightness and shortness of breath.   Cardiovascular: Negative for chest pain, palpitations and leg swelling.  Gastrointestinal: Negative for abdominal pain, diarrhea, constipation and abdominal distention.  Genitourinary: Negative for urgency, frequency, decreased urine volume and difficulty urinating.  Musculoskeletal: Negative for back pain, arthralgias and gait problem.  Skin: Negative for color change, pallor and rash.  Neurological: Negative for dizziness, light-headedness, numbness and headaches.  Hematological: Negative for adenopathy. Does not bruise/bleed easily.  Psychiatric/Behavioral: Negative for suicidal ideas, confusion, sleep disturbance, self-injury, dysphoric mood, decreased concentration and agitation.       Objective:    BP 110/70 (BP Location: Left Arm, Cuff Size: Normal)   Pulse (!) 57   Temp 98 F (36.7 C) (Oral)   Resp 16   Ht 5\' 5"  (1.651 m)   Wt 156 lb (70.8 kg)   SpO2 98%   BMI 25.96 kg/m  General appearance: alert, cooperative, appears stated age and no distress Head: Normocephalic, without obvious abnormality, atraumatic Eyes: conjunctivae/corneas clear. PERRL, EOM's intact. Fundi benign. Ears: normal TM's and external ear canals both ears Nose: Nares normal. Septum midline. Mucosa normal. No drainage or sinus tenderness. Throat: lips, mucosa, and tongue normal; teeth and gums normal Neck: no adenopathy, no carotid bruit, no JVD, supple, symmetrical, trachea midline and thyroid not enlarged, symmetric, no tenderness/mass/nodules Back: symmetric, no curvature. ROM normal. No CVA tenderness. Lungs: clear to auscultation bilaterally Breasts: gyn Heart: regular rate and rhythm, S1, S2 normal, no murmur, click, rub or  gallop Abdomen: soft, non-tender; bowel sounds normal; no masses,  no organomegaly Pelvic: deferred---gyn Extremities: extremities normal, atraumatic, no cyanosis or edema Pulses: 2+ and symmetric Skin: Skin color, texture, turgor normal. No rashes or lesions Lymph nodes: Cervical, supraclavicular, and axillary nodes normal. Neurologic: Alert and oriented X 3, normal strength and tone. Normal symmetric reflexes. Normal coordination and gait    Assessment:    Healthy female exam.      Plan:    ghm utd Check labs  See After Visit Summary for Counseling Recommendations

## 2018-04-23 NOTE — Patient Instructions (Signed)
Preventive Care 40-64 Years, Female Preventive care refers to lifestyle choices and visits with your health care provider that can promote health and wellness. What does preventive care include?   A yearly physical exam. This is also called an annual well check.  Dental exams once or twice a year.  Routine eye exams. Ask your health care provider how often you should have your eyes checked.  Personal lifestyle choices, including: ? Daily care of your teeth and gums. ? Regular physical activity. ? Eating a healthy diet. ? Avoiding tobacco and drug use. ? Limiting alcohol use. ? Practicing safe sex. ? Taking low-dose aspirin daily starting at age 50. ? Taking vitamin and mineral supplements as recommended by your health care provider. What happens during an annual well check? The services and screenings done by your health care provider during your annual well check will depend on your age, overall health, lifestyle risk factors, and family history of disease. Counseling Your health care provider may ask you questions about your:  Alcohol use.  Tobacco use.  Drug use.  Emotional well-being.  Home and relationship well-being.  Sexual activity.  Eating habits.  Work and work environment.  Method of birth control.  Menstrual cycle.  Pregnancy history. Screening You may have the following tests or measurements:  Height, weight, and BMI.  Blood pressure.  Lipid and cholesterol levels. These may be checked every 5 years, or more frequently if you are over 50 years old.  Skin check.  Lung cancer screening. You may have this screening every year starting at age 55 if you have a 30-pack-year history of smoking and currently smoke or have quit within the past 15 years.  Colorectal cancer screening. All adults should have this screening starting at age 50 and continuing until age 75. Your health care provider may recommend screening at age 45. You will have tests every  1-10 years, depending on your results and the type of screening test. People at increased risk should start screening at an earlier age. Screening tests may include: ? Guaiac-based fecal occult blood testing. ? Fecal immunochemical test (FIT). ? Stool DNA test. ? Virtual colonoscopy. ? Sigmoidoscopy. During this test, a flexible tube with a tiny camera (sigmoidoscope) is used to examine your rectum and lower colon. The sigmoidoscope is inserted through your anus into your rectum and lower colon. ? Colonoscopy. During this test, a long, thin, flexible tube with a tiny camera (colonoscope) is used to examine your entire colon and rectum.  Hepatitis C blood test.  Hepatitis B blood test.  Sexually transmitted disease (STD) testing.  Diabetes screening. This is done by checking your blood sugar (glucose) after you have not eaten for a while (fasting). You may have this done every 1-3 years.  Mammogram. This may be done every 1-2 years. Talk to your health care provider about when you should start having regular mammograms. This may depend on whether you have a family history of breast cancer.  BRCA-related cancer screening. This may be done if you have a family history of breast, ovarian, tubal, or peritoneal cancers.  Pelvic exam and Pap test. This may be done every 3 years starting at age 21. Starting at age 30, this may be done every 5 years if you have a Pap test in combination with an HPV test.  Bone density scan. This is done to screen for osteoporosis. You may have this scan if you are at high risk for osteoporosis. Discuss your test results, treatment options,   and if necessary, the need for more tests with your health care provider. Vaccines Your health care provider may recommend certain vaccines, such as:  Influenza vaccine. This is recommended every year.  Tetanus, diphtheria, and acellular pertussis (Tdap, Td) vaccine. You may need a Td booster every 10 years.  Varicella  vaccine. You may need this if you have not been vaccinated.  Zoster vaccine. You may need this after age 38.  Measles, mumps, and rubella (MMR) vaccine. You may need at least one dose of MMR if you were born in 1957 or later. You may also need a second dose.  Pneumococcal 13-valent conjugate (PCV13) vaccine. You may need this if you have certain conditions and were not previously vaccinated.  Pneumococcal polysaccharide (PPSV23) vaccine. You may need one or two doses if you smoke cigarettes or if you have certain conditions.  Meningococcal vaccine. You may need this if you have certain conditions.  Hepatitis A vaccine. You may need this if you have certain conditions or if you travel or work in places where you may be exposed to hepatitis A.  Hepatitis B vaccine. You may need this if you have certain conditions or if you travel or work in places where you may be exposed to hepatitis B.  Haemophilus influenzae type b (Hib) vaccine. You may need this if you have certain conditions. Talk to your health care provider about which screenings and vaccines you need and how often you need them. This information is not intended to replace advice given to you by your health care provider. Make sure you discuss any questions you have with your health care provider. Document Released: 04/30/2015 Document Revised: 05/24/2017 Document Reviewed: 02/02/2015 Elsevier Interactive Patient Education  2019 [employer].

## 2018-04-24 DIAGNOSIS — M25561 Pain in right knee: Secondary | ICD-10-CM | POA: Diagnosis not present

## 2018-04-30 DIAGNOSIS — M25561 Pain in right knee: Secondary | ICD-10-CM | POA: Diagnosis not present

## 2018-05-01 DIAGNOSIS — M1711 Unilateral primary osteoarthritis, right knee: Secondary | ICD-10-CM | POA: Diagnosis not present

## 2018-05-08 DIAGNOSIS — M25561 Pain in right knee: Secondary | ICD-10-CM | POA: Diagnosis not present

## 2018-05-08 DIAGNOSIS — M1711 Unilateral primary osteoarthritis, right knee: Secondary | ICD-10-CM | POA: Diagnosis not present

## 2018-05-10 DIAGNOSIS — M25561 Pain in right knee: Secondary | ICD-10-CM | POA: Diagnosis not present

## 2018-05-14 DIAGNOSIS — M25561 Pain in right knee: Secondary | ICD-10-CM | POA: Diagnosis not present

## 2018-05-17 DIAGNOSIS — M25561 Pain in right knee: Secondary | ICD-10-CM | POA: Diagnosis not present

## 2018-05-21 DIAGNOSIS — M25561 Pain in right knee: Secondary | ICD-10-CM | POA: Diagnosis not present

## 2018-05-24 DIAGNOSIS — M25561 Pain in right knee: Secondary | ICD-10-CM | POA: Diagnosis not present

## 2018-06-24 DIAGNOSIS — Z01419 Encounter for gynecological examination (general) (routine) without abnormal findings: Secondary | ICD-10-CM | POA: Diagnosis not present

## 2018-06-24 DIAGNOSIS — Z1231 Encounter for screening mammogram for malignant neoplasm of breast: Secondary | ICD-10-CM | POA: Diagnosis not present

## 2018-06-24 DIAGNOSIS — Z6826 Body mass index (BMI) 26.0-26.9, adult: Secondary | ICD-10-CM | POA: Diagnosis not present

## 2018-10-28 IMAGING — MR MR KNEE*R* W/O CM
4 of 7 series · 18 of 40 positions shown · non-contrast
Comparison: None.

CLINICAL DATA: Medial right knee pain. Feels like is catching for
months.

EXAM:
MRI OF THE RIGHT KNEE WITHOUT CONTRAST
TECHNIQUE: Multiplanar, multisequence MR imaging of the knee was performed. No
intravenous contrast was administered.

[Series 3: T2 fat-sat · axial · 4.0mm · 0.31mm/px · z∈[-19,+56]mm · 3 of 27 slices shown]
[im 5/27]
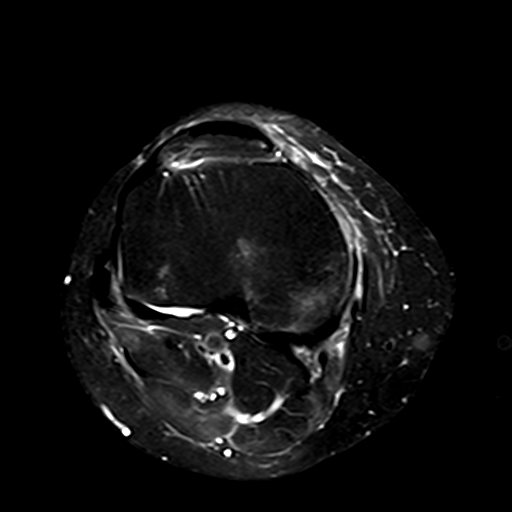
[im 14/27]
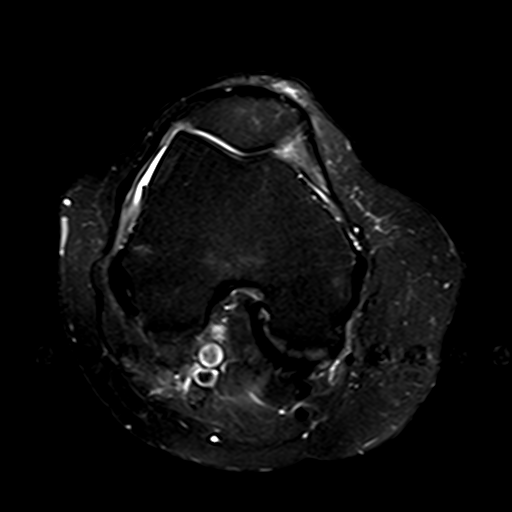
[im 22/27]
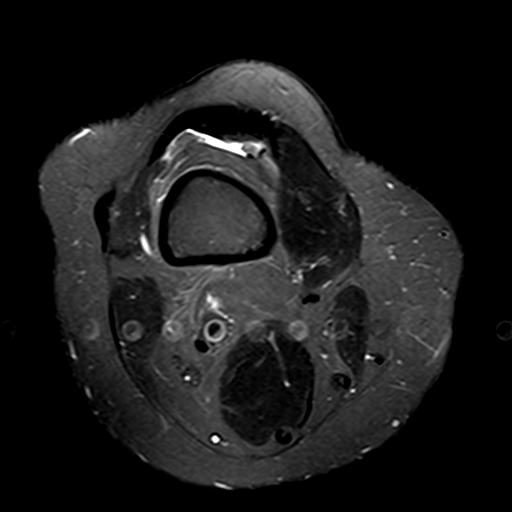

[Series 6: PD fat-sat · coronal · 3.0mm · 0.29mm/px · 7 of 31 slices shown (1 of 3)]
[im 1/31]
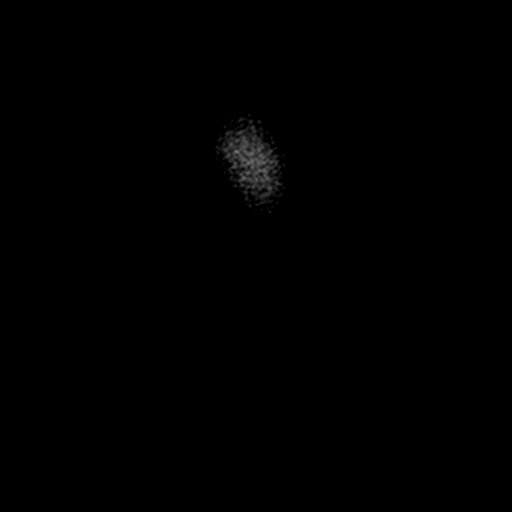
[im 6/31]
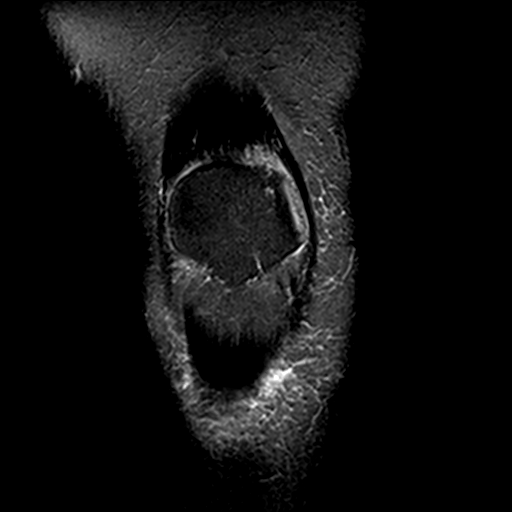
[im 11/31]
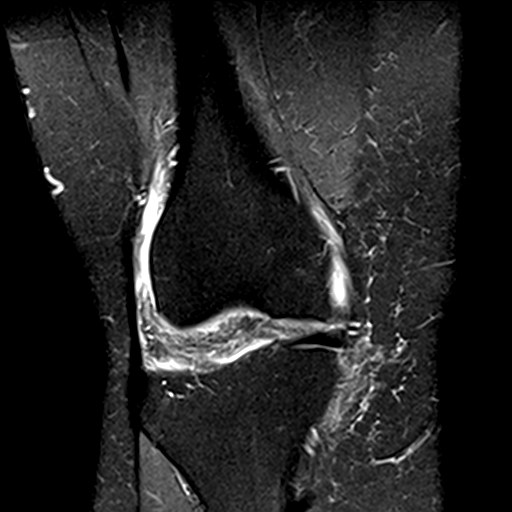
[im 16/31]
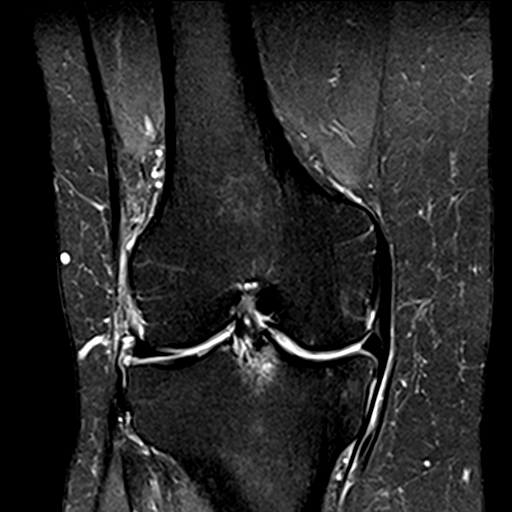
[im 21/31]
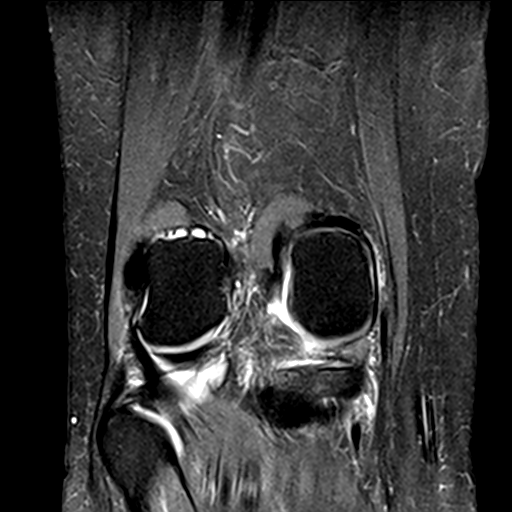
[im 26/31]
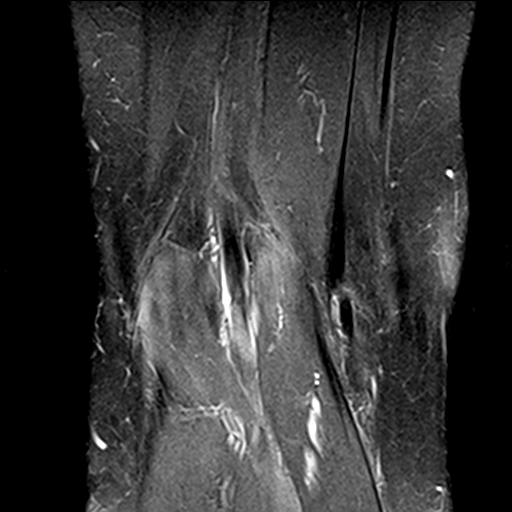
[im 31/31]
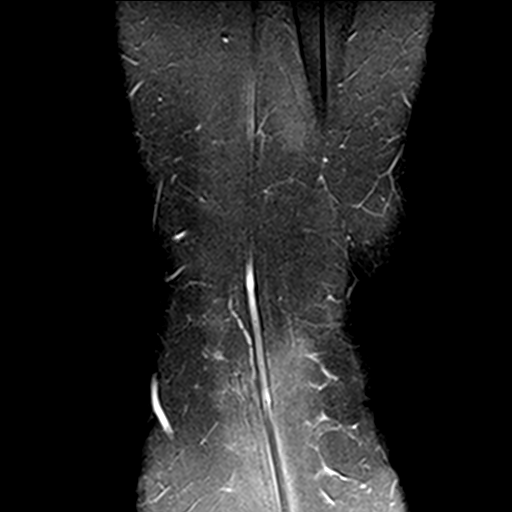

[Series 7: PD fat-sat · sagittal · 3.0mm · 0.29mm/px · 5 of 30 slices shown (2 of 3)]
[im 1/30]
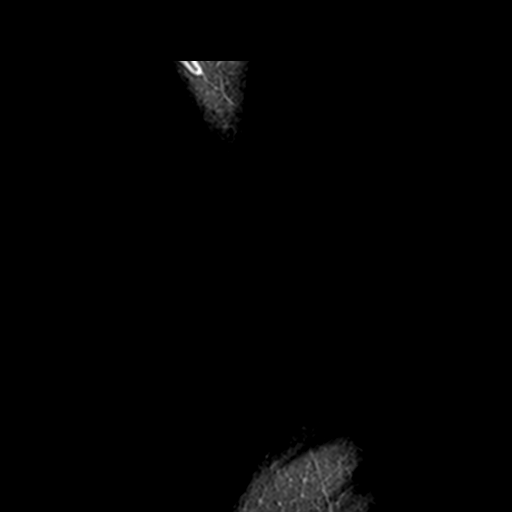
[im 5/30]
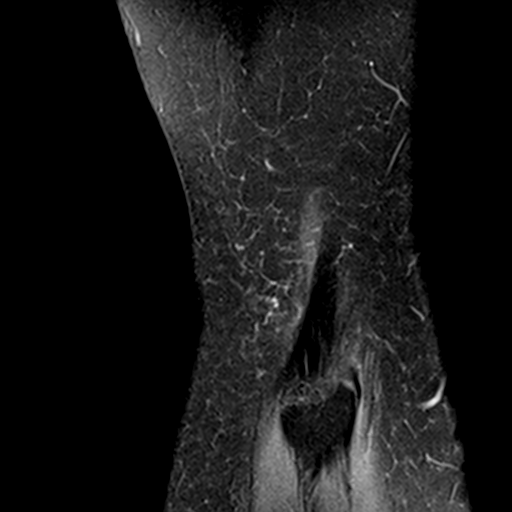
[im 10/30]
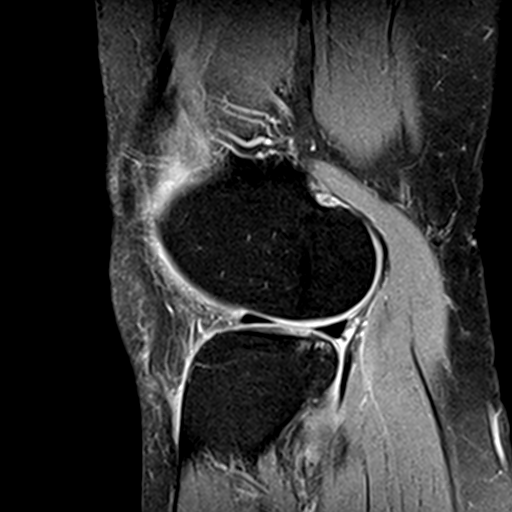
[im 15/30]
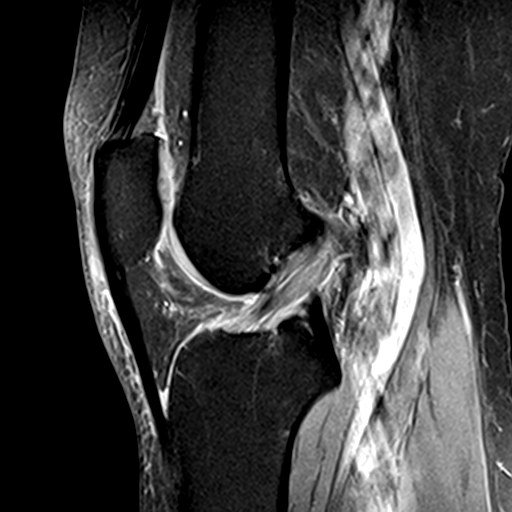
[im 25/30]
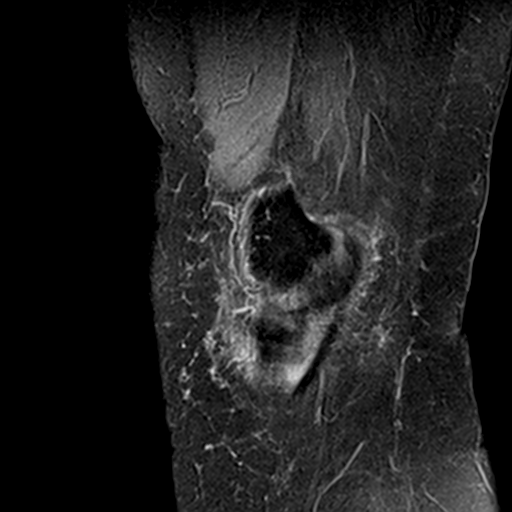

[Series 9: PD fat-sat · oblique · 2.0mm · 0.29mm/px · 3 of 11 slices shown (3 of 3)]
[im 1/11]
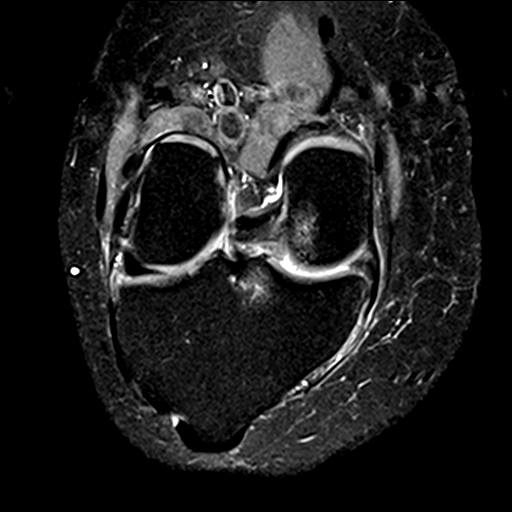
[im 6/11]
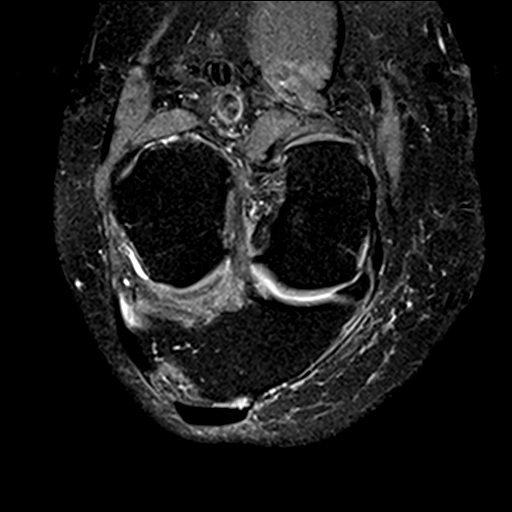
[im 11/11]
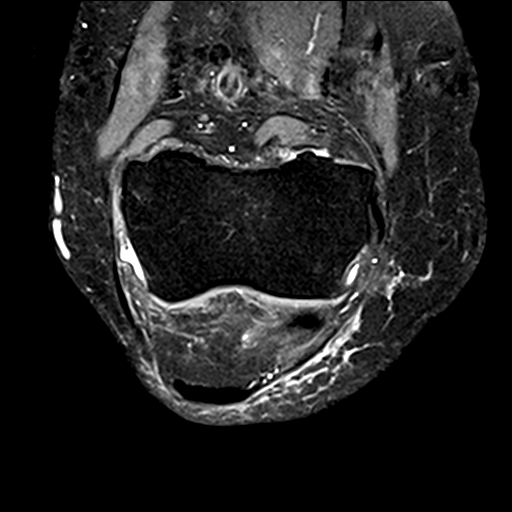

[18 of 40 positions shown; findings below may reference images not displayed]

FINDINGS: MENISCI

Medial meniscus: Attenuation of the posterior horn-body junction of
the medial meniscus consistent with prior partial meniscectomy.
Irregularity along the undersurface of the posterior horn of the
medial meniscus may reflect post meniscectomy changes versus a small
tear.

Lateral meniscus:  Intact

LIGAMENTS

Cruciates:  Intact ACL and PCL.

Collaterals: Medial collateral ligament is intact. Lateral
collateral ligament complex is intact.

CARTILAGE

Patellofemoral: Partial-thickness cartilage loss of the medial
patellar facet.

Medial: High-grade partial-thickness cartilage loss of the medial
femorotibial compartment with subchondral reactive marrow edema
along the lateral aspect of the medial femoral condyle.

Lateral: Mild chondromalacia of lateral tibial plateau with mild
subchondral reactive marrow changes.

Joint: Trace joint effusion. Minimal edema in Hoffa's fat. No plical
thickening.

Popliteal Fossa:  No Baker cyst. Intact popliteus tendon.

Extensor Mechanism: Intact quadriceps tendon. Intact patellar
tendon. Intact medial patellar retinaculum. Intact lateral patellar
retinaculum. Intact MPFL.

Bones:  No acute osseous abnormality.  No aggressive osseous lesion.

Other: Muscles are normal.  No muscle atrophy.
IMPRESSION: 1. Attenuation of the posterior horn-body junction of the medial
meniscus consistent with prior partial meniscectomy. Irregularity
along the undersurface of the posterior horn of the medial meniscus
may reflect post meniscectomy changes versus a small tear.
2. Partial-thickness cartilage loss of the medial patellar facet.
3. High-grade partial-thickness cartilage loss of the medial
femorotibial compartment with subchondral reactive marrow edema
along the lateral aspect of the medial femoral condyle.
4. Mild chondromalacia of lateral tibial plateau with mild
subchondral reactive marrow changes.

## 2018-10-29 ENCOUNTER — Other Ambulatory Visit: Payer: Federal, State, Local not specified - PPO

## 2018-10-31 ENCOUNTER — Encounter: Payer: Self-pay | Admitting: Family Medicine

## 2018-10-31 DIAGNOSIS — M8580 Other specified disorders of bone density and structure, unspecified site: Secondary | ICD-10-CM

## 2018-11-05 ENCOUNTER — Other Ambulatory Visit: Payer: Self-pay | Admitting: Obstetrics and Gynecology

## 2018-11-05 DIAGNOSIS — Z803 Family history of malignant neoplasm of breast: Secondary | ICD-10-CM

## 2018-11-25 ENCOUNTER — Ambulatory Visit
Admission: RE | Admit: 2018-11-25 | Discharge: 2018-11-25 | Disposition: A | Discharge status: Home / Self Care | Payer: Federal, State, Local not specified - PPO | Source: Ambulatory Visit | Attending: Obstetrics and Gynecology | Admitting: Obstetrics and Gynecology

## 2018-11-25 ENCOUNTER — Other Ambulatory Visit: Payer: Self-pay

## 2018-11-25 DIAGNOSIS — Z803 Family history of malignant neoplasm of breast: Secondary | ICD-10-CM

## 2018-11-25 MED ORDER — GADOBUTROL 1 MMOL/ML IV SOLN
7.0000 mL | Freq: Once | INTRAVENOUS | Status: AC | PRN
  Administered 2018-11-25: 7 mL via INTRAVENOUS

## 2019-01-15 ENCOUNTER — Other Ambulatory Visit: Payer: Federal, State, Local not specified - PPO

## 2019-04-01 ENCOUNTER — Ambulatory Visit
Admission: RE | Admit: 2019-04-01 | Discharge: 2019-04-01 | Disposition: A | Discharge status: Home / Self Care | Payer: Federal, State, Local not specified - PPO | Source: Ambulatory Visit | Attending: Family Medicine | Admitting: Family Medicine

## 2019-04-01 ENCOUNTER — Other Ambulatory Visit: Payer: Self-pay

## 2019-04-01 DIAGNOSIS — Z780 Asymptomatic menopausal state: Secondary | ICD-10-CM | POA: Diagnosis not present

## 2019-04-01 DIAGNOSIS — M810 Age-related osteoporosis without current pathological fracture: Secondary | ICD-10-CM | POA: Diagnosis not present

## 2019-04-01 DIAGNOSIS — M8580 Other specified disorders of bone density and structure, unspecified site: Secondary | ICD-10-CM

## 2019-04-01 DIAGNOSIS — M85852 Other specified disorders of bone density and structure, left thigh: Secondary | ICD-10-CM | POA: Diagnosis not present

## 2019-04-04 ENCOUNTER — Other Ambulatory Visit: Payer: Self-pay

## 2019-04-04 MED ORDER — ALENDRONATE SODIUM 70 MG PO TABS: 70.0000 mg | ORAL_TABLET | ORAL | 3 refills | Status: DC

## 2019-05-12 ENCOUNTER — Other Ambulatory Visit: Payer: Self-pay

## 2019-05-13 ENCOUNTER — Ambulatory Visit (INDEPENDENT_AMBULATORY_CARE_PROVIDER_SITE_OTHER): Payer: Federal, State, Local not specified - PPO | Admitting: Family Medicine

## 2019-05-13 ENCOUNTER — Other Ambulatory Visit: Payer: Self-pay

## 2019-05-13 ENCOUNTER — Encounter: Payer: Self-pay | Admitting: Family Medicine

## 2019-05-13 VITALS — BP 110/78 | HR 60 | Temp 97.8°F | Resp 18 | Ht 65.0 in | Wt 163.8 lb

## 2019-05-13 DIAGNOSIS — M810 Age-related osteoporosis without current pathological fracture: Secondary | ICD-10-CM

## 2019-05-13 DIAGNOSIS — Z0000 Encounter for general adult medical examination without abnormal findings: Secondary | ICD-10-CM

## 2019-05-13 NOTE — Progress Notes (Signed)
Subjective:     Christy Morales is a 59 y.o. female and is here for a comprehensive physical exam. The patient reports no new problems--- she did not p/u her fosamax because she did not realize it was there-she did not see her my chart message .  Social History   Socioeconomic History  . Marital status: Married    Spouse name: Dr. Marcelyn Bruins  . Number of children: Not on file  . Years of education: Not on file  . Highest education level: Not on file  Occupational History  . Occupation: nursing with the [employer]  Tobacco Use  . Smoking status: Never Smoker  . Smokeless tobacco: Never Used  Substance and Sexual Activity  . Alcohol use: Yes    Alcohol/week: 2.0 standard drinks    Types: 2 Glasses of wine per week    Comment: OCC WINE  . Drug use: No  . Sexual activity: Yes    Partners: Male  Other Topics Concern  . Not on file  Social History Narrative   Exercise twice a week   Social Determinants of Health   Financial Resource Strain:   . Difficulty of Paying Living Expenses: Not on file  Food Insecurity:   . Worried About Programme researcher, broadcasting/film/video in the Last Year: Not on file  . Ran Out of Food in the Last Year: Not on file  Transportation Needs:   . Lack of Transportation (Medical): Not on file  . Lack of Transportation (Non-Medical): Not on file  Physical Activity:   . Days of Exercise per Week: Not on file  . Minutes of Exercise per Session: Not on file  Stress:   . Feeling of Stress : Not on file  Social Connections:   . Frequency of Communication with Friends and Family: Not on file  . Frequency of Social Gatherings with Friends and Family: Not on file  . Attends Religious Services: Not on file  . Active Member of Clubs or Organizations: Not on file  . Attends Banker Meetings: Not on file  . Marital Status: Not on file  Intimate Partner Violence:   . Fear of Current or Ex-Partner: Not on file  . Emotionally Abused: Not on file  . Physically  Abused: Not on file  . Sexually Abused: Not on file   Health Maintenance  Topic Date Due  . MAMMOGRAM  09/16/2014  . PAP SMEAR-Modifier  09/15/2016  . TETANUS/TDAP  04/29/2018  . INFLUENZA VACCINE  07/16/2019 (Originally 11/16/2018)  . COLONOSCOPY  06/05/2024  . Hepatitis C Screening  Completed  . HIV Screening  Completed    The following portions of the patient's history were reviewed and updated as appropriate:  She  has a past medical history of Complication of anesthesia, DJD (degenerative joint disease), Family history of breast cancer, History of shingles, Osteopenia, Paroxysmal supraventricular tachycardia (HCC), and PONV (postoperative nausea and vomiting). She does not have any pertinent problems on file. She  has a past surgical history that includes tubal pregnancy with D & C; right knee arthroscopy for meniscus tear; COLONSCOPY; and Knee arthroscopy with medial menisectomy (Right, 03/21/2018). Her family history includes Aneurysm in her paternal grandfather; Breast cancer (age of onset: 25) in her sister; Breast cancer (age of onset: 63) in her mother; Cancer in her maternal grandfather; Esophageal cancer (age of onset: 35) in her father; Heart attack in her mother; Heart disease in her father and mother; High Cholesterol in her father and mother;  Hyperparathyroidism in her sister; Osteoporosis in her maternal grandmother and sister; Other in her sister; Pneumonia in her paternal grandmother; Stomach cancer (age of onset: 20) in her father. She  reports that she has never smoked. She has never used smokeless tobacco. She reports current alcohol use of about 2.0 standard drinks of alcohol per week. She reports that she does not use drugs. She has a current medication list which includes the following prescription(s): calcium citrate-vitamin d and alendronate. Current Outpatient Medications on File Prior to Visit  Medication Sig Dispense Refill  . calcium citrate-vitamin D (CITRACAL+D)  315-200 MG-UNIT per tablet Take 2 tablets by mouth 2 (two) times daily. CALCIUM 630 PER TAB AND VIT D 500 UNITS PER 2 CAPSULES    . alendronate (FOSAMAX) 70 MG tablet Take 1 tablet (70 mg total) by mouth every 7 (seven) days. Take with a full glass of water on an empty stomach. (Patient not taking: Reported on 05/13/2019) 12 tablet 3   No current facility-administered medications on file prior to visit.   She is allergic to penicillins and sulfamethoxazole-trimethoprim..  Review of Systems Review of Systems  Constitutional: Negative for activity change, appetite change and fatigue.  HENT: Negative for hearing loss, congestion, tinnitus and ear discharge.  dentist q32m Eyes: Negative for visual disturbance (see optho q1y -- vision corrected to 20/20 with glasses).  Respiratory: Negative for cough, chest tightness and shortness of breath.   Cardiovascular: Negative for chest pain, palpitations and leg swelling.  Gastrointestinal: Negative for abdominal pain, diarrhea, constipation and abdominal distention.  Genitourinary: Negative for urgency, frequency, decreased urine volume and difficulty urinating.  Musculoskeletal: Negative for back pain,  and gait problem. +knee pain  Skin: Negative for color change, pallor and rash.  Neurological: Negative for dizziness, light-headedness, numbness and headaches.  Hematological: Negative for adenopathy. Does not bruise/bleed easily.  Psychiatric/Behavioral: Negative for suicidal ideas, confusion, sleep disturbance, self-injury, dysphoric mood, decreased concentration and agitation.       Objective:    BP 110/78 (BP Location: Right Arm, Patient Position: Sitting, Cuff Size: Normal)   Pulse 60   Temp 97.8 F (36.6 C) (Temporal)   Resp 18   Ht 5\' 5"  (1.651 m)   Wt 163 lb 12.8 oz (74.3 kg)   SpO2 98%   BMI 27.26 kg/m  General appearance: alert, cooperative, appears stated age and no distress Head: Normocephalic, without obvious abnormality,  atraumatic Eyes: negative findings: lids and lashes normal, conjunctivae and sclerae normal and pupils equal, round, reactive to light and accomodation Ears: normal TM's and external ear canals both ears Neck: no adenopathy, no carotid bruit, no JVD, supple, symmetrical, trachea midline and thyroid not enlarged, symmetric, no tenderness/mass/nodules Back: negative Lungs: clear to auscultation bilaterally Heart: regular rate and rhythm, S1, S2 normal, no murmur, click, rub or gallop Abdomen: soft, non-tender; bowel sounds normal; no masses,  no organomegaly Extremities: extremities normal, atraumatic, no cyanosis or edema Pulses: 2+ and symmetric Skin: Skin color, texture, turgor normal. No rashes or lesions Lymph nodes: Cervical, supraclavicular, and axillary nodes normal. Neurologic: Alert and oriented X 3, normal strength and tone. Normal symmetric reflexes. Normal coordination and gait    Assessment:    Healthy female exam.      Plan:    ghm utd--- pt does not take vaccines She will check on her tetanus and let know We discussed covid vaccine  See After Visit Summary for Counseling Recommendations     1. Preventative health care See above  -  Lipid panel; Future - CBC with Differential/Platelet; Future - TSH; Future - Comprehensive metabolic panel; Future  2. Age-related osteoporosis without current pathological fracture Pt has severe gerd -- she is able to control it with diet She prefers not to take fosamax due to this Will try to get prolia approved Labs scheduled  - Comprehensive metabolic panel; Future - VITAMIN D 25 Hydroxy (Vit-D Deficiency, Fractures); Future

## 2019-05-13 NOTE — Patient Instructions (Addendum)
Preventive Care 40-60 Years Old, Female Preventive care refers to visits with your health care provider and lifestyle choices that can promote health and wellness. This includes:  A yearly physical exam. This may also be called an annual well check.  Regular dental visits and eye exams.  Immunizations.  Screening for certain conditions.  Healthy lifestyle choices, such as eating a healthy diet, getting regular exercise, not using drugs or products that contain nicotine and tobacco, and limiting alcohol use. What can I expect for my preventive care visit? Physical exam Your health care provider will check your:  Height and weight. This may be used to calculate body mass index (BMI), which tells if you are at a healthy weight.  Heart rate and blood pressure.  Skin for abnormal spots. Counseling Your health care provider may ask you questions about your:  Alcohol, tobacco, and drug use.  Emotional well-being.  Home and relationship well-being.  Sexual activity.  Eating habits.  Work and work environment.  Method of birth control.  Menstrual cycle.  Pregnancy history. What immunizations do I need?  Influenza (flu) vaccine  This is recommended every year. Tetanus, diphtheria, and pertussis (Tdap) vaccine  You may need a Td booster every 10 years. Varicella (chickenpox) vaccine  You may need this if you have not been vaccinated. Zoster (shingles) vaccine  You may need this after age 60. Measles, mumps, and rubella (MMR) vaccine  You may need at least one dose of MMR if you were born in 1957 or later. You may also need a second dose. Pneumococcal conjugate (PCV13) vaccine  You may need this if you have certain conditions and were not previously vaccinated. Pneumococcal polysaccharide (PPSV23) vaccine  You may need one or two doses if you smoke cigarettes or if you have certain conditions. Meningococcal conjugate (MenACWY) vaccine  You may need this if you  have certain conditions. Hepatitis A vaccine  You may need this if you have certain conditions or if you travel or work in places where you may be exposed to hepatitis A. Hepatitis B vaccine  You may need this if you have certain conditions or if you travel or work in places where you may be exposed to hepatitis B. Haemophilus influenzae type b (Hib) vaccine  You may need this if you have certain conditions. Human papillomavirus (HPV) vaccine  If recommended by your health care provider, you may need three doses over 6 months. You may receive vaccines as individual doses or as more than one vaccine together in one shot (combination vaccines). Talk with your health care provider about the risks and benefits of combination vaccines. What tests do I need? Blood tests  Lipid and cholesterol levels. These may be checked every 5 years, or more frequently if you are over 50 years old.  Hepatitis C test.  Hepatitis B test. Screening  Lung cancer screening. You may have this screening every year starting at age 55 if you have a 30-pack-year history of smoking and currently smoke or have quit within the past 15 years.  Colorectal cancer screening. All adults should have this screening starting at age 50 and continuing until age 75. Your health care provider may recommend screening at age 45 if you are at increased risk. You will have tests every 1-10 years, depending on your results and the type of screening test.  Diabetes screening. This is done by checking your blood sugar (glucose) after you have not eaten for a while (fasting). You may have this   done every 1-3 years.  Mammogram. This may be done every 1-2 years. Talk with your health care provider about when you should start having regular mammograms. This may depend on whether you have a family history of breast cancer.  BRCA-related cancer screening. This may be done if you have a family history of breast, ovarian, tubal, or peritoneal  cancers.  Pelvic exam and Pap test. This may be done every 3 years starting at age 81. Starting at age 60, this may be done every 5 years if you have a Pap test in combination with an HPV test. Other tests  Sexually transmitted disease (STD) testing.  Bone density scan. This is done to screen for osteoporosis. You may have this scan if you are at high risk for osteoporosis. Follow these instructions at home: Eating and drinking  Eat a diet that includes fresh fruits and vegetables, whole grains, lean protein, and low-fat dairy.  Take vitamin and mineral supplements as recommended by your health care provider.  Do not drink alcohol if: ? Your health care provider tells you not to drink. ? You are pregnant, may be pregnant, or are planning to become pregnant.  If you drink alcohol: ? Limit how much you have to 0-1 drink a day. ? Be aware of how much alcohol is in your drink. In the [place] one drink equals one 12 oz bottle of beer (355 mL), one 5 oz glass of wine (148 mL), or one 1 oz glass of hard liquor (44 mL). Lifestyle  Take daily care of your teeth and gums.  Stay active. Exercise for at least 30 minutes on 5 or more days each week.  Do not use any products that contain nicotine or tobacco, such as cigarettes, e-cigarettes, and chewing tobacco. If you need help quitting, ask your health care provider.  If you are sexually active, practice safe sex. Use a condom or other form of birth control (contraception) in order to prevent pregnancy and STIs (sexually transmitted infections).  If told by your health care provider, take low-dose aspirin daily starting at age 39. What's next?  Visit your health care provider once a year for a well check visit.  Ask your health care provider how often you should have your eyes and teeth checked.  Stay up to date on all vaccines. This information is not intended to replace advice given to you by your health care provider. Make sure you  discuss any questions you have with your health care provider. Document Revised: 12/13/2017 Document Reviewed: 12/13/2017 Elsevier Patient Education  [place].    Calcium Content in Foods Calcium is the most abundant mineral in your body. Most of your body's calcium supply is stored in your bones and teeth. Calcium helps many parts of the body function normally, including:  Blood and blood vessels.  Nerves.  Hormones.  Muscles.  Bones and teeth. When your calcium stores are low, you may be at risk for low bone mass, bone loss, and broken bones (fractures). When you get enough calcium, it helps to support strong bones and teeth throughout your life. Calcium is especially important for:  Children during growth spurts.  Girls during adolescence.  Women who are pregnant or breastfeeding.  Women after their menstrual cycle stops (postmenopause).  Women whose menstrual cycle has stopped due to anorexia nervosa or regular intense exercise.  People who cannot eat or digest dairy products.  Vegans. What are tips for getting more calcium? General information  Try to  get most of your calcium from food. Eat foods that are high in calcium.  Some people may benefit from taking calcium supplements. Check with your health care provider or diet and nutrition specialist (dietitian) before starting any calcium supplements. Calcium supplements may interact with certain medicines. Too much calcium may cause other health problems, like constipation and kidney stones.  For the body to absorb calcium, it needs vitamin D. Sources of vitamin D include: ? Skin exposure to direct sunlight. ? Foods, such as egg yolks, liver, saltwater fish, and fortified milk. ? Vitamin D supplements. Check with your health care provider or dietitian before starting any vitamin D supplements. What foods are high in calcium?  High-calcium foods are those that contain more than 100 milligrams (mg) of  calcium per serving. Fruits  Fortified orange or other fruit juice, 300 mg per 8 oz serving. Vegetables  Collard greens, 360 mg per 8 oz serving.  Kale, 180 mg per 8 oz serving.  Bok choy, 160 mg per 8 oz serving. Grains  Fortified ready-to-eat cereals, 100-1,000 mg per 8 oz serving.  Fortified frozen waffles, 200 mg in two waffles. Meats and other proteins  Sardines, canned with bones, 325 mg per 3 oz serving.  Salmon, canned with bones, 180 mg per 3 oz serving.  Canned shrimp, 125 mg per 3 oz serving.  Baked beans, 160 mg per 4 oz serving. Dairy  Yogurt, plain, low-fat, 310 mg per 6 oz serving.  Milk, 300 mg per 8 oz serving.  American cheese, 195 mg per 1 oz serving.  Cheddar cheese, 205 mg per 1 oz serving.  Cottage cheese 2%, 105 mg per 4 oz serving.  Fortified soy, rice, or almond milk, 300 mg per 8 oz serving. The items listed above may not be a complete list of foods high in calcium. Actual amounts of calcium may be different depending on processing. Contact a dietitian for more information. What foods are lower in calcium? Foods lower in calcium are those that contain 50 mg of calcium or less per serving. Fruits  Apple, about 6 mg in one apple.  Banana, about 12 mg in one banana. Vegetables  Lettuce, 19 mg per 2 oz serving.  Tomato, about 11 mg in one tomato. Grains  Rice, 4 mg per 6 oz serving.  Boiled potatoes, 14 mg per 8 oz serving.  White bread, 6 mg in one slice. Meats and other proteins  Egg, 27 mg per 2 oz serving.  Red meat, 7 mg per 4 oz serving.  Chicken, 17 mg per 4 oz serving.  Fish, cod or trout, 20 mg per 4 oz serving. The items listed above may not be a complete list of foods lower in calcium. Actual amounts of calcium may be different depending on processing. Contact a dietitian for more information. Summary  Calcium is an important mineral in the body because it affects many functions. Getting enough calcium helps  support strong bones and teeth throughout your life.  Try to get most of your calcium from food.  Calcium supplements may interact with certain medicines. Check with your health care provider before starting any calcium supplements. This information is not intended to replace advice given to you by your health care provider. Make sure you discuss any questions you have with your health care provider. Document Revised: 03/27/2017 Document Reviewed: 03/27/2017 Elsevier Patient Education  2020 [employer].

## 2019-05-15 ENCOUNTER — Telehealth: Payer: Self-pay | Admitting: *Deleted

## 2019-05-15 ENCOUNTER — Other Ambulatory Visit: Payer: Federal, State, Local not specified - PPO

## 2019-05-15 NOTE — Telephone Encounter (Signed)
Left message on machine to call back for screening for appt tomorrow 

## 2019-05-16 ENCOUNTER — Other Ambulatory Visit (INDEPENDENT_AMBULATORY_CARE_PROVIDER_SITE_OTHER): Payer: Federal, State, Local not specified - PPO

## 2019-05-16 ENCOUNTER — Other Ambulatory Visit: Payer: Self-pay

## 2019-05-16 DIAGNOSIS — M810 Age-related osteoporosis without current pathological fracture: Secondary | ICD-10-CM

## 2019-05-16 DIAGNOSIS — Z0000 Encounter for general adult medical examination without abnormal findings: Secondary | ICD-10-CM | POA: Diagnosis not present

## 2019-05-16 LAB — COMPREHENSIVE METABOLIC PANEL
ALT: 8 U/L (ref 0–35)
AST: 15 U/L (ref 0–37)
Albumin: 4.4 g/dL (ref 3.5–5.2)
Alkaline Phosphatase: 53 U/L (ref 39–117)
BUN: 23 mg/dL (ref 6–23)
CO2: 32 mEq/L (ref 19–32)
Calcium: 9.8 mg/dL (ref 8.4–10.5)
Chloride: 103 mEq/L (ref 96–112)
Creatinine, Ser: 0.78 mg/dL (ref 0.40–1.20)
GFR: 75.32 mL/min (ref >60.00)
Glucose, Bld: 88 mg/dL (ref 70–99)
Potassium: 4.6 mEq/L (ref 3.5–5.1)
Sodium: 139 mEq/L (ref 135–145)
Total Bilirubin: 0.6 mg/dL (ref 0.2–1.2)
Total Protein: 7 g/dL (ref 6.0–8.3)

## 2019-05-16 LAB — CBC WITH DIFFERENTIAL/PLATELET
Basophils Absolute: 0 10*3/uL (ref 0.0–0.1)
Basophils Relative: 1 % (ref 0.0–3.0)
Eosinophils Absolute: 0.1 10*3/uL (ref 0.0–0.7)
Eosinophils Relative: 2.7 % (ref 0.0–5.0)
HCT: 41.7 % (ref 36.0–46.0)
Hemoglobin: 14.1 g/dL (ref 12.0–15.0)
Lymphocytes Relative: 36.7 % (ref 12.0–46.0)
Lymphs Abs: 1.3 10*3/uL (ref 0.7–4.0)
MCHC: 33.8 g/dL (ref 30.0–36.0)
MCV: 95.3 fl (ref 78.0–100.0)
Monocytes Absolute: 0.3 10*3/uL (ref 0.1–1.0)
Monocytes Relative: 8.1 % (ref 3.0–12.0)
Neutro Abs: 1.8 10*3/uL (ref 1.4–7.7)
Neutrophils Relative %: 51.5 % (ref 43.0–77.0)
Platelets: 177 10*3/uL (ref 150.0–400.0)
RBC: 4.38 Mil/uL (ref 3.87–5.11)
RDW: 13 % (ref 11.5–15.5)
WBC: 3.5 10*3/uL — ABNORMAL LOW (ref 4.0–10.5)

## 2019-05-16 LAB — LIPID PANEL
Cholesterol: 216 mg/dL — ABNORMAL HIGH (ref 0–200)
HDL: 75.8 mg/dL (ref >39.00)
LDL Cholesterol: 129 mg/dL — ABNORMAL HIGH (ref 0–99)
NonHDL: 140.26
Total CHOL/HDL Ratio: 3
Triglycerides: 56 mg/dL (ref 0.0–149.0)
VLDL: 11.2 mg/dL (ref 0.0–40.0)

## 2019-05-16 LAB — TSH: TSH: 4.04 u[IU]/mL (ref 0.35–4.50)

## 2019-05-16 LAB — VITAMIN D 25 HYDROXY (VIT D DEFICIENCY, FRACTURES): VITD: 32.54 ng/mL (ref 30.00–100.00)

## 2019-05-19 ENCOUNTER — Other Ambulatory Visit: Payer: Self-pay | Admitting: Family Medicine

## 2019-05-19 ENCOUNTER — Encounter: Payer: Self-pay | Admitting: Family Medicine

## 2019-05-19 MED ORDER — FAMOTIDINE 40 MG PO TABS: 40.0000 mg | ORAL_TABLET | Freq: Every day | ORAL | 3 refills | Status: DC

## 2019-05-19 NOTE — Telephone Encounter (Signed)
pepcid sent in 

## 2019-05-20 MED ORDER — FAMOTIDINE 40 MG PO TABS: 40.0000 mg | ORAL_TABLET | Freq: Every day | ORAL | 3 refills | Status: DC

## 2019-05-20 NOTE — Addendum Note (Signed)
Addended by: Steve Rattler A on: 05/20/2019 09:16 AM   Modules accepted: Orders

## 2019-11-25 DIAGNOSIS — Z1231 Encounter for screening mammogram for malignant neoplasm of breast: Secondary | ICD-10-CM | POA: Diagnosis not present

## 2019-12-30 DIAGNOSIS — H35362 Drusen (degenerative) of macula, left eye: Secondary | ICD-10-CM | POA: Diagnosis not present

## 2019-12-30 DIAGNOSIS — H4423 Degenerative myopia, bilateral: Secondary | ICD-10-CM | POA: Diagnosis not present

## 2019-12-30 DIAGNOSIS — H2513 Age-related nuclear cataract, bilateral: Secondary | ICD-10-CM | POA: Diagnosis not present

## 2019-12-30 DIAGNOSIS — H10413 Chronic giant papillary conjunctivitis, bilateral: Secondary | ICD-10-CM | POA: Diagnosis not present

## 2019-12-31 DIAGNOSIS — Z6828 Body mass index (BMI) 28.0-28.9, adult: Secondary | ICD-10-CM | POA: Diagnosis not present

## 2019-12-31 DIAGNOSIS — Z01419 Encounter for gynecological examination (general) (routine) without abnormal findings: Secondary | ICD-10-CM | POA: Diagnosis not present

## 2020-01-01 ENCOUNTER — Other Ambulatory Visit: Payer: Self-pay | Admitting: Obstetrics and Gynecology

## 2020-01-01 DIAGNOSIS — Z803 Family history of malignant neoplasm of breast: Secondary | ICD-10-CM

## 2020-01-30 ENCOUNTER — Other Ambulatory Visit: Payer: Federal, State, Local not specified - PPO

## 2020-02-06 ENCOUNTER — Other Ambulatory Visit: Payer: Self-pay

## 2020-02-06 ENCOUNTER — Ambulatory Visit
Admission: RE | Admit: 2020-02-06 | Discharge: 2020-02-06 | Disposition: A | Discharge status: Home / Self Care | Payer: Federal, State, Local not specified - PPO | Source: Ambulatory Visit | Attending: Obstetrics and Gynecology | Admitting: Obstetrics and Gynecology

## 2020-02-06 DIAGNOSIS — Z803 Family history of malignant neoplasm of breast: Secondary | ICD-10-CM

## 2020-02-06 DIAGNOSIS — N6489 Other specified disorders of breast: Secondary | ICD-10-CM | POA: Diagnosis not present

## 2020-02-06 MED ORDER — GADOBUTROL 1 MMOL/ML IV SOLN
7.0000 mL | Freq: Once | INTRAVENOUS | Status: AC | PRN
  Administered 2020-02-06: 7 mL via INTRAVENOUS

## 2020-02-26 DIAGNOSIS — H43811 Vitreous degeneration, right eye: Secondary | ICD-10-CM | POA: Diagnosis not present

## 2020-04-14 ENCOUNTER — Encounter: Payer: Self-pay | Admitting: Family Medicine

## 2020-04-14 ENCOUNTER — Other Ambulatory Visit: Payer: Self-pay | Admitting: Family Medicine

## 2020-04-14 MED ORDER — ALENDRONATE SODIUM 70 MG PO TABS: 70.0000 mg | ORAL_TABLET | ORAL | 3 refills | Status: DC

## 2020-04-14 NOTE — Telephone Encounter (Signed)
Does not look like she is due for bmd until next dec

## 2020-06-16 ENCOUNTER — Encounter: Payer: Self-pay | Admitting: Family Medicine

## 2020-06-16 ENCOUNTER — Other Ambulatory Visit: Payer: Self-pay

## 2020-06-16 ENCOUNTER — Telehealth (INDEPENDENT_AMBULATORY_CARE_PROVIDER_SITE_OTHER): Payer: Federal, State, Local not specified - PPO | Admitting: Family Medicine

## 2020-06-16 DIAGNOSIS — J0140 Acute pansinusitis, unspecified: Secondary | ICD-10-CM

## 2020-06-16 MED ORDER — CEFDINIR 300 MG PO CAPS: 300.0000 mg | ORAL_CAPSULE | Freq: Two times a day (BID) | ORAL | 0 refills | Status: DC

## 2020-06-16 MED ORDER — FLUTICASONE PROPIONATE 50 MCG/ACT NA SUSP: 2.0000 | Freq: Every day | NASAL | 6 refills | Status: DC

## 2020-06-16 NOTE — Progress Notes (Signed)
Virtual Visit via Video Note  I connected with Christy Morales on 06/16/20 at  8:40 AM EST by a video enabled telemedicine application and verified that I am speaking with the correct person using two identifiers.  Location/ participants in ov Patient: home alone Provider: home    I discussed the limitations of evaluation and management by telemedicine and the availability of in person appointments. The patient expressed understanding and agreed to proceed.  History of Present Illness: Pt is home c/o sinus pressure / congestion x 10 days.  She tested neg for covid   She is taking dayquil and nyquil with little relief and nyquil disrupts her sleep.   + yellow/ green nasal mucus  No fevers    Observations/Objective: There were no vitals filed for this visit. Pt is in nad \ No sob   Assessment and Plan: 1. Acute non-recurrent pansinusitis abx and flonase per orders rto prn  - cefdinir (OMNICEF) 300 MG capsule; Take 1 capsule (300 mg total) by mouth 2 (two) times daily.  Dispense: 20 capsule; Refill: 0 - fluticasone (FLONASE) 50 MCG/ACT nasal spray; Place 2 sprays into both nostrils daily.  Dispense: 16 g; Refill: 6   Follow Up Instructions:    I discussed the assessment and treatment plan with the patient. The patient was provided an opportunity to ask questions and all were answered. The patient agreed with the plan and demonstrated an understanding of the instructions.   The patient was advised to call back or seek an in-person evaluation if the symptoms worsen or if the condition fails to improve as anticipated.     Donato Schultz, DO

## 2020-07-07 ENCOUNTER — Other Ambulatory Visit: Payer: Self-pay

## 2020-07-07 MED ORDER — FAMOTIDINE 40 MG PO TABS: 40.0000 mg | ORAL_TABLET | Freq: Every day | ORAL | 1 refills | Status: DC

## 2021-01-31 DIAGNOSIS — Z7689 Persons encountering health services in other specified circumstances: Secondary | ICD-10-CM | POA: Diagnosis not present

## 2021-01-31 DIAGNOSIS — Z1231 Encounter for screening mammogram for malignant neoplasm of breast: Secondary | ICD-10-CM | POA: Diagnosis not present

## 2021-01-31 DIAGNOSIS — Z01419 Encounter for gynecological examination (general) (routine) without abnormal findings: Secondary | ICD-10-CM | POA: Diagnosis not present

## 2021-02-07 ENCOUNTER — Other Ambulatory Visit: Payer: Self-pay | Admitting: Obstetrics and Gynecology

## 2021-02-07 DIAGNOSIS — R928 Other abnormal and inconclusive findings on diagnostic imaging of breast: Secondary | ICD-10-CM

## 2021-02-25 ENCOUNTER — Ambulatory Visit
Admission: RE | Admit: 2021-02-25 | Discharge: 2021-02-25 | Disposition: A | Discharge status: Home / Self Care | Payer: Federal, State, Local not specified - PPO | Source: Ambulatory Visit | Attending: Obstetrics and Gynecology | Admitting: Obstetrics and Gynecology

## 2021-02-25 ENCOUNTER — Ambulatory Visit: Payer: Federal, State, Local not specified - PPO

## 2021-02-25 DIAGNOSIS — R922 Inconclusive mammogram: Secondary | ICD-10-CM | POA: Diagnosis not present

## 2021-02-25 DIAGNOSIS — R928 Other abnormal and inconclusive findings on diagnostic imaging of breast: Secondary | ICD-10-CM

## 2021-03-14 ENCOUNTER — Other Ambulatory Visit: Payer: Self-pay | Admitting: Obstetrics and Gynecology

## 2021-03-14 DIAGNOSIS — Z803 Family history of malignant neoplasm of breast: Secondary | ICD-10-CM

## 2021-03-15 ENCOUNTER — Other Ambulatory Visit: Payer: Self-pay | Admitting: Family Medicine

## 2021-03-21 ENCOUNTER — Other Ambulatory Visit: Payer: Self-pay | Admitting: Family Medicine

## 2021-03-21 DIAGNOSIS — J0140 Acute pansinusitis, unspecified: Secondary | ICD-10-CM

## 2021-03-22 ENCOUNTER — Other Ambulatory Visit: Payer: Self-pay

## 2021-03-22 ENCOUNTER — Other Ambulatory Visit: Payer: Self-pay | Admitting: Family Medicine

## 2021-03-22 ENCOUNTER — Encounter: Payer: Self-pay | Admitting: Family Medicine

## 2021-03-22 DIAGNOSIS — M810 Age-related osteoporosis without current pathological fracture: Secondary | ICD-10-CM

## 2021-03-22 MED ORDER — ALENDRONATE SODIUM 70 MG PO TABS: 70.0000 mg | ORAL_TABLET | ORAL | 3 refills | Status: DC

## 2021-04-15 ENCOUNTER — Ambulatory Visit
Admission: RE | Admit: 2021-04-15 | Discharge: 2021-04-15 | Disposition: A | Discharge status: Home / Self Care | Payer: Federal, State, Local not specified - PPO | Source: Ambulatory Visit | Attending: Family Medicine | Admitting: Family Medicine

## 2021-04-15 DIAGNOSIS — Z780 Asymptomatic menopausal state: Secondary | ICD-10-CM | POA: Diagnosis not present

## 2021-04-15 DIAGNOSIS — M810 Age-related osteoporosis without current pathological fracture: Secondary | ICD-10-CM

## 2021-04-15 DIAGNOSIS — M8589 Other specified disorders of bone density and structure, multiple sites: Secondary | ICD-10-CM | POA: Diagnosis not present

## 2021-04-27 ENCOUNTER — Other Ambulatory Visit: Payer: Self-pay

## 2021-04-27 ENCOUNTER — Ambulatory Visit
Admission: RE | Admit: 2021-04-27 | Discharge: 2021-04-27 | Disposition: A | Discharge status: Home / Self Care | Payer: Federal, State, Local not specified - PPO | Source: Ambulatory Visit | Attending: Obstetrics and Gynecology | Admitting: Obstetrics and Gynecology

## 2021-04-27 DIAGNOSIS — Z803 Family history of malignant neoplasm of breast: Secondary | ICD-10-CM

## 2021-04-27 DIAGNOSIS — Z1389 Encounter for screening for other disorder: Secondary | ICD-10-CM | POA: Diagnosis not present

## 2021-04-27 MED ORDER — GADOBUTROL 1 MMOL/ML IV SOLN
9.0000 mL | Freq: Once | INTRAVENOUS | Status: AC | PRN
  Administered 2021-04-27: 9 mL via INTRAVENOUS

## 2021-06-02 ENCOUNTER — Encounter: Payer: Self-pay | Admitting: Family Medicine

## 2021-06-02 ENCOUNTER — Other Ambulatory Visit: Payer: Self-pay | Admitting: Family Medicine

## 2021-06-02 DIAGNOSIS — J0140 Acute pansinusitis, unspecified: Secondary | ICD-10-CM

## 2021-06-02 MED ORDER — FAMOTIDINE 40 MG PO TABS: ORAL_TABLET | ORAL | 3 refills | Status: DC

## 2021-06-02 MED ORDER — FLUTICASONE PROPIONATE 50 MCG/ACT NA SUSP: 2.0000 | Freq: Every day | NASAL | 6 refills | Status: DC

## 2021-06-14 ENCOUNTER — Encounter: Payer: Self-pay | Admitting: Family Medicine

## 2022-02-18 ENCOUNTER — Other Ambulatory Visit: Payer: Self-pay | Admitting: Family Medicine

## 2022-03-15 ENCOUNTER — Other Ambulatory Visit: Payer: Self-pay | Admitting: Family Medicine

## 2022-03-22 ENCOUNTER — Other Ambulatory Visit: Payer: Self-pay | Admitting: Family Medicine

## 2022-03-23 ENCOUNTER — Encounter: Payer: Self-pay | Admitting: Family Medicine

## 2022-03-24 MED ORDER — ALENDRONATE SODIUM 70 MG PO TABS: 70.0000 mg | ORAL_TABLET | ORAL | 0 refills | Status: DC

## 2022-03-29 DIAGNOSIS — Z6828 Body mass index (BMI) 28.0-28.9, adult: Secondary | ICD-10-CM | POA: Diagnosis not present

## 2022-03-29 DIAGNOSIS — Z01419 Encounter for gynecological examination (general) (routine) without abnormal findings: Secondary | ICD-10-CM | POA: Diagnosis not present

## 2022-03-29 DIAGNOSIS — Z1231 Encounter for screening mammogram for malignant neoplasm of breast: Secondary | ICD-10-CM | POA: Diagnosis not present

## 2022-04-05 ENCOUNTER — Encounter: Payer: Self-pay | Admitting: Obstetrics and Gynecology

## 2022-04-05 ENCOUNTER — Other Ambulatory Visit: Payer: Self-pay | Admitting: Obstetrics and Gynecology

## 2022-04-05 DIAGNOSIS — Z803 Family history of malignant neoplasm of breast: Secondary | ICD-10-CM

## 2022-05-02 DIAGNOSIS — H4423 Degenerative myopia, bilateral: Secondary | ICD-10-CM | POA: Diagnosis not present

## 2022-05-02 DIAGNOSIS — H43393 Other vitreous opacities, bilateral: Secondary | ICD-10-CM | POA: Diagnosis not present

## 2022-05-02 DIAGNOSIS — H04123 Dry eye syndrome of bilateral lacrimal glands: Secondary | ICD-10-CM | POA: Diagnosis not present

## 2022-05-02 DIAGNOSIS — H2513 Age-related nuclear cataract, bilateral: Secondary | ICD-10-CM | POA: Diagnosis not present

## 2022-05-04 ENCOUNTER — Encounter: Payer: Self-pay | Admitting: Family Medicine

## 2022-05-04 ENCOUNTER — Ambulatory Visit (INDEPENDENT_AMBULATORY_CARE_PROVIDER_SITE_OTHER): Payer: Federal, State, Local not specified - PPO | Admitting: Family Medicine

## 2022-05-04 VITALS — BP 104/70 | HR 66 | Temp 97.8°F | Resp 18 | Ht 65.0 in | Wt 172.4 lb

## 2022-05-04 DIAGNOSIS — Z0000 Encounter for general adult medical examination without abnormal findings: Secondary | ICD-10-CM

## 2022-05-04 DIAGNOSIS — M810 Age-related osteoporosis without current pathological fracture: Secondary | ICD-10-CM | POA: Diagnosis not present

## 2022-05-04 DIAGNOSIS — H9311 Tinnitus, right ear: Secondary | ICD-10-CM | POA: Diagnosis not present

## 2022-05-04 MED ORDER — ALENDRONATE SODIUM 70 MG PO TABS: 70.0000 mg | ORAL_TABLET | ORAL | 3 refills | Status: DC

## 2022-05-04 NOTE — Progress Notes (Signed)
Subjective:   By signing my name below, I, Shehryar Baig, attest that this documentation has been prepared under the direction and in the presence of [name], DO. 05/04/2022   Patient ID: Christy Morales, female    DOB: September 26, 1960, 62 y.o.   MRN: [id number]  Chief Complaint  Patient presents with   Annual Exam    Pt states not fasting     HPI Patient is in today for a comprehensive physical exam.   She complains of ringing in her ears since she last contracted Covid-19 a couple years ago. Her symptoms worsened in the past month. Her hearing is normal. She had right ear pain while standing outside in the cold, otherwise she had no episodes of pain. She has no other symptoms indicating Covid-19.  She denies having any fever, new moles, congestion, sore throat, sinus pain, new muscle pain, new joint pain, chest pain, palpations, cough, SOB, wheezing, n/v/d, constipation, dysuria, frequency, hematuria, or headaches at this time. She has no changes in family medical history.  She continues exercising a couple times weekly.  She has a history of shingles but does not have the shingles vaccine. She is not interested in receiving the shingles vaccine. She does not remember when she last completed the tetanus vaccine and is planning on checking with her records before receiving it.  Her las colonoscopy was completed on 06/04/2014. Result showed diverticulosis in sigmoid colon and in distal descending colon, two small polyps in the mid ascending colon, otherwise results are normal. Repeat in 10 years.  Last mammogram was completed 04/27/2021. Results are normal. Repeat in 1 year.  Bone density was completed 04/15/2021. Results show she is osteopenic. Repeat in 2 years.  Pap smear was last completed 02/15/2022.  She is UTD on dental care.  She is UTD on vision care.    Past Medical History:  Diagnosis Date   Complication of anesthesia    DJD (degenerative joint disease)    RIGHT KNEE    Family history of breast cancer    History of shingles    Osteopenia    LOWER SPINE AND RIGHT HIP   Paroxysmal supraventricular tachycardia    YRS AGO, NO CAUSE FOUND    PONV (postoperative nausea and vomiting)     Past Surgical History:  Procedure Laterality Date   COLONSCOPY     KNEE ARTHROSCOPY WITH MEDIAL MENISECTOMY Right 03/21/2018   Procedure: RIGHT KNEE ARTHROSCOPY WITH PARTIAL MEDIAL MENISECTOMY, CHONDROPLASTY, ARTHROSCOPIC INTERNAL FIXATION MEDIAL FEMORAL CONDYLE;  Surgeon: Sydnee Cabal, MD;  Location: [facility];  Service: Orthopedics;  Laterality: Right;   right knee arthroscopy for meniscus tear     Dr. Onnie Graham   tubal pregnancy with D & C      Family History  Problem Relation Age of Onset   High Cholesterol Father    Heart disease Father    Esophageal cancer Father 90       smoker and heavy drinker   Stomach cancer Father 71       smoker and heavy drinker   Breast cancer Mother 84   High Cholesterol Mother    Heart disease Mother    Heart attack Mother        d. 9y   Breast cancer Sister 75       d. 63y; took fertility hormones   Other Sister        hx cervical ablation   Osteoporosis Maternal Grandmother  Osteoporosis Sister    Hyperparathyroidism Sister        adenoma   Cancer Maternal Grandfather        oral cancer dx 82s; smoker and SL tob user   Pneumonia Paternal Grandmother        d. 30s   Aneurysm Paternal Grandfather        brain; d. 42s    Social History   Socioeconomic History   Marital status: Married    Spouse name: Dr. Marcelyn Bruins   Number of children: Not on file   Years of education: Not on file   Highest education level: Not on file  Occupational History   Occupation: nursing with [employer]  Tobacco Use   Smoking status: Never   Smokeless tobacco: Never  Vaping Use   Vaping Use: Never used  Substance and Sexual Activity   Alcohol use: Yes    Alcohol/week: 2.0 standard drinks of alcohol     Types: 2 Glasses of wine per week    Comment: OCC WINE   Drug use: No   Sexual activity: Yes    Partners: Male  Other Topics Concern   Not on file  Social History Narrative   Exercise twice a week   Social Determinants of Health   Financial Resource Strain: Not on file  Food Insecurity: Not on file  Transportation Needs: Not on file  Physical Activity: Not on file  Stress: Not on file  Social Connections: Not on file  Intimate Partner Violence: Not on file    Outpatient Medications Prior to Visit  Medication Sig Dispense Refill   calcium citrate-vitamin D (CITRACAL+D) 315-200 MG-UNIT per tablet Take 2 tablets by mouth 2 (two) times daily. CALCIUM 630 PER TAB AND VIT D 500 UNITS PER 2 CAPSULES     famotidine (PEPCID) 40 MG tablet TAKE 1 TABLET(40 MG) BY MOUTH DAILY 90 tablet 3   fluticasone (FLONASE) 50 MCG/ACT nasal spray Place 2 sprays into both nostrils daily. 16 g 6   alendronate (FOSAMAX) 70 MG tablet Take 1 tablet (70 mg total) by mouth once a week. 12 tablet 0   cefdinir (OMNICEF) 300 MG capsule Take 1 capsule (300 mg total) by mouth 2 (two) times daily. 20 capsule 0   No facility-administered medications prior to visit.    Allergies  Allergen Reactions   Penicillins     GI UPSET, DONE OK WITH PENCILLIAN RECENTLY   Sulfamethoxazole-Trimethoprim     REACTION: rash SEVERE BACTRIM    Review of Systems  Constitutional:  Negative for fever.  HENT:  Positive for tinnitus. Negative for congestion, sinus pain and sore throat.   Respiratory:  Negative for cough, shortness of breath and wheezing.   Cardiovascular:  Negative for chest pain and palpitations.  Gastrointestinal:  Negative for abdominal pain, constipation, diarrhea, nausea and vomiting.  Genitourinary:  Negative for dysuria, frequency and hematuria.  Musculoskeletal:        (-)new muscle pain (-)new joint pain  Skin:        (-)New moles  Neurological:  Negative for headaches.       Objective:     Physical Exam Vitals and nursing note reviewed.  Constitutional:      General: She is not in acute distress.    Appearance: Normal appearance. She is not ill-appearing.  HENT:     Head: Normocephalic and atraumatic.     Right Ear: Tympanic membrane, ear canal and external ear normal.     Left  Ear: Tympanic membrane, ear canal and external ear normal.  Eyes:     Extraocular Movements: Extraocular movements intact.     Pupils: Pupils are equal, round, and reactive to light.  Cardiovascular:     Rate and Rhythm: Normal rate and regular rhythm.     Heart sounds: Normal heart sounds. No murmur heard.    No gallop.  Pulmonary:     Effort: Pulmonary effort is normal. No respiratory distress.     Breath sounds: Normal breath sounds. No wheezing or rales.  Abdominal:     General: Bowel sounds are normal. There is no distension.     Palpations: Abdomen is soft.     Tenderness: There is no abdominal tenderness. There is no guarding.  Skin:    General: Skin is warm and dry.  Neurological:     Mental Status: She is alert and oriented to person, place, and time.  Psychiatric:        Judgment: Judgment normal.     BP 104/70 (BP Location: Right Arm, Patient Position: Sitting, Cuff Size: Normal)   Pulse 66   Temp 97.8 F (36.6 C) (Oral)   Resp 18   Ht 5\' 5"  (1.651 m)   Wt 172 lb 6.4 oz (78.2 kg)   SpO2 98%   BMI 28.69 kg/m  Wt Readings from Last 3 Encounters:  05/04/22 172 lb 6.4 oz (78.2 kg)  05/13/19 163 lb 12.8 oz (74.3 kg)  04/23/18 156 lb (70.8 kg)       Assessment & Plan:  Preventative health care Assessment & Plan: Ghm utd Check labs  See AVS   Orders: -     CBC with Differential/Platelet; Future -     Comprehensive metabolic panel; Future -     Lipid panel; Future -     TSH; Future  Tinnitus of right ear -     Ambulatory referral to ENT -     Thyroid Panel With TSH; Future -     Vitamin B12; Future -     VITAMIN D 25 Hydroxy (Vit-D Deficiency, Fractures);  Future  Osteoporosis without current pathological fracture, unspecified osteoporosis type -     Alendronate Sodium; Take 1 tablet (70 mg total) by mouth once a week.  Dispense: 12 tablet; Refill: 3    I, [name], DO, personally preformed the services described in this documentation.  All medical record entries made by the scribe were at my direction and in my presence.  I have reviewed the chart and discharge instructions (if applicable) and agree that the record reflects my personal performance and is accurate and complete. 05/04/2022   I,Shehryar Baig,acting as a scribe for [name], DO.,have documented all relevant documentation on the behalf of [name], DO,as directed by  [name], DO while in the presence of [name], DO.   I,Shehryar Baig,acting as a scribe for [name], DO.,have documented all relevant documentation on the behalf of [name], DO,as directed by  [name], DO while in the presence of [name], DO.    [name], DO

## 2022-05-05 DIAGNOSIS — H9311 Tinnitus, right ear: Secondary | ICD-10-CM | POA: Insufficient documentation

## 2022-05-05 DIAGNOSIS — M810 Age-related osteoporosis without current pathological fracture: Secondary | ICD-10-CM | POA: Insufficient documentation

## 2022-05-05 NOTE — Assessment & Plan Note (Signed)
Ghm utd Check labs  See AVS  

## 2022-05-11 ENCOUNTER — Other Ambulatory Visit (INDEPENDENT_AMBULATORY_CARE_PROVIDER_SITE_OTHER): Payer: Federal, State, Local not specified - PPO

## 2022-05-11 DIAGNOSIS — H9311 Tinnitus, right ear: Secondary | ICD-10-CM

## 2022-05-11 DIAGNOSIS — Z0000 Encounter for general adult medical examination without abnormal findings: Secondary | ICD-10-CM

## 2022-05-11 LAB — COMPREHENSIVE METABOLIC PANEL
ALT: 6 U/L (ref 0–35)
AST: 14 U/L (ref 0–37)
Albumin: 4.2 g/dL (ref 3.5–5.2)
Alkaline Phosphatase: 38 U/L — ABNORMAL LOW (ref 39–117)
BUN: 17 mg/dL (ref 6–23)
CO2: 28 mEq/L (ref 19–32)
Calcium: 9.1 mg/dL (ref 8.4–10.5)
Chloride: 105 mEq/L (ref 96–112)
Creatinine, Ser: 0.84 mg/dL (ref 0.40–1.20)
GFR: 74.15 mL/min (ref >60.00)
Glucose, Bld: 93 mg/dL (ref 70–99)
Potassium: 4.2 mEq/L (ref 3.5–5.1)
Sodium: 140 mEq/L (ref 135–145)
Total Bilirubin: 0.7 mg/dL (ref 0.2–1.2)
Total Protein: 6.7 g/dL (ref 6.0–8.3)

## 2022-05-11 LAB — LIPID PANEL
Cholesterol: 228 mg/dL — ABNORMAL HIGH (ref 0–200)
HDL: 66.6 mg/dL (ref >39.00)
LDL Cholesterol: 148 mg/dL — ABNORMAL HIGH (ref 0–99)
NonHDL: 161.84
Total CHOL/HDL Ratio: 3
Triglycerides: 71 mg/dL (ref 0.0–149.0)
VLDL: 14.2 mg/dL (ref 0.0–40.0)

## 2022-05-11 LAB — CBC WITH DIFFERENTIAL/PLATELET
Basophils Absolute: 0.1 10*3/uL (ref 0.0–0.1)
Basophils Relative: 1.8 % (ref 0.0–3.0)
Eosinophils Absolute: 0.1 10*3/uL (ref 0.0–0.7)
Eosinophils Relative: 2.9 % (ref 0.0–5.0)
HCT: 41 % (ref 36.0–46.0)
Hemoglobin: 14.1 g/dL (ref 12.0–15.0)
Lymphocytes Relative: 44.3 % (ref 12.0–46.0)
Lymphs Abs: 1.6 10*3/uL (ref 0.7–4.0)
MCHC: 34.4 g/dL (ref 30.0–36.0)
MCV: 94.7 fl (ref 78.0–100.0)
Monocytes Absolute: 0.3 10*3/uL (ref 0.1–1.0)
Monocytes Relative: 7.3 % (ref 3.0–12.0)
Neutro Abs: 1.6 10*3/uL (ref 1.4–7.7)
Neutrophils Relative %: 43.7 % (ref 43.0–77.0)
Platelets: 196 10*3/uL (ref 150.0–400.0)
RBC: 4.33 Mil/uL (ref 3.87–5.11)
RDW: 12.9 % (ref 11.5–15.5)
WBC: 3.6 10*3/uL — ABNORMAL LOW (ref 4.0–10.5)

## 2022-05-11 LAB — VITAMIN B12: Vitamin B-12: 285 pg/mL (ref 211–911)

## 2022-05-11 LAB — TSH: TSH: 5.04 u[IU]/mL (ref 0.35–5.50)

## 2022-05-11 LAB — VITAMIN D 25 HYDROXY (VIT D DEFICIENCY, FRACTURES): VITD: 35.11 ng/mL (ref 30.00–100.00)

## 2022-05-12 ENCOUNTER — Other Ambulatory Visit: Payer: Self-pay | Admitting: Family Medicine

## 2022-05-12 DIAGNOSIS — R7989 Other specified abnormal findings of blood chemistry: Secondary | ICD-10-CM

## 2022-05-12 LAB — THYROID PANEL WITH TSH
Free Thyroxine Index: 1.7 (ref 1.4–3.8)
T3 Uptake: 33 % (ref 22–35)
T4, Total: 5.1 ug/dL (ref 5.1–11.9)
TSH: 5.16 mIU/L — ABNORMAL HIGH (ref 0.40–4.50)

## 2022-05-23 ENCOUNTER — Other Ambulatory Visit: Payer: Self-pay | Admitting: Family Medicine

## 2022-05-23 DIAGNOSIS — J0140 Acute pansinusitis, unspecified: Secondary | ICD-10-CM

## 2022-05-23 DIAGNOSIS — M810 Age-related osteoporosis without current pathological fracture: Secondary | ICD-10-CM

## 2022-06-16 DIAGNOSIS — H9313 Tinnitus, bilateral: Secondary | ICD-10-CM | POA: Diagnosis not present

## 2022-07-07 ENCOUNTER — Other Ambulatory Visit: Payer: Self-pay | Admitting: Family Medicine

## 2022-07-13 DIAGNOSIS — H9313 Tinnitus, bilateral: Secondary | ICD-10-CM | POA: Diagnosis not present

## 2022-07-13 DIAGNOSIS — H938X1 Other specified disorders of right ear: Secondary | ICD-10-CM | POA: Diagnosis not present

## 2022-07-13 DIAGNOSIS — H903 Sensorineural hearing loss, bilateral: Secondary | ICD-10-CM | POA: Diagnosis not present

## 2022-08-16 ENCOUNTER — Other Ambulatory Visit: Payer: Federal, State, Local not specified - PPO

## 2022-08-16 DIAGNOSIS — R7989 Other specified abnormal findings of blood chemistry: Secondary | ICD-10-CM

## 2022-08-17 LAB — THYROID PANEL WITH TSH
Free Thyroxine Index: 2.1 (ref 1.4–3.8)
T3 Uptake: 31 % (ref 22–35)
T4, Total: 6.9 ug/dL (ref 5.1–11.9)
TSH: 3.21 mIU/L (ref 0.40–4.50)

## 2022-09-28 ENCOUNTER — Ambulatory Visit
Admission: RE | Admit: 2022-09-28 | Discharge: 2022-09-28 | Disposition: A | Discharge status: Home / Self Care | Payer: Federal, State, Local not specified - PPO | Source: Ambulatory Visit | Attending: Obstetrics and Gynecology | Admitting: Obstetrics and Gynecology

## 2022-09-28 DIAGNOSIS — Z803 Family history of malignant neoplasm of breast: Secondary | ICD-10-CM | POA: Diagnosis not present

## 2022-09-28 MED ORDER — GADOPICLENOL 0.5 MMOL/ML IV SOLN
6.0000 mL | Freq: Once | INTRAVENOUS | Status: AC | PRN
  Administered 2022-09-28: 6 mL via INTRAVENOUS

## 2023-04-05 DIAGNOSIS — Z01419 Encounter for gynecological examination (general) (routine) without abnormal findings: Secondary | ICD-10-CM | POA: Diagnosis not present

## 2023-04-05 DIAGNOSIS — Z6828 Body mass index (BMI) 28.0-28.9, adult: Secondary | ICD-10-CM | POA: Diagnosis not present

## 2023-04-17 DIAGNOSIS — Z1231 Encounter for screening mammogram for malignant neoplasm of breast: Secondary | ICD-10-CM | POA: Diagnosis not present

## 2023-04-24 ENCOUNTER — Other Ambulatory Visit: Payer: Self-pay | Admitting: Obstetrics and Gynecology

## 2023-04-24 DIAGNOSIS — R92333 Mammographic heterogeneous density, bilateral breasts: Secondary | ICD-10-CM

## 2023-05-07 ENCOUNTER — Ambulatory Visit (INDEPENDENT_AMBULATORY_CARE_PROVIDER_SITE_OTHER): Payer: Federal, State, Local not specified - PPO | Admitting: Family Medicine

## 2023-05-07 ENCOUNTER — Encounter: Payer: Self-pay | Admitting: Family Medicine

## 2023-05-07 ENCOUNTER — Telehealth: Payer: Self-pay

## 2023-05-07 VITALS — BP 100/80 | HR 56 | Temp 97.8°F | Resp 16 | Ht 65.0 in | Wt 170.2 lb

## 2023-05-07 DIAGNOSIS — Z0000 Encounter for general adult medical examination without abnormal findings: Secondary | ICD-10-CM | POA: Diagnosis not present

## 2023-05-07 DIAGNOSIS — Z808 Family history of malignant neoplasm of other organs or systems: Secondary | ICD-10-CM

## 2023-05-07 DIAGNOSIS — M810 Age-related osteoporosis without current pathological fracture: Secondary | ICD-10-CM

## 2023-05-07 LAB — COMPREHENSIVE METABOLIC PANEL
ALT: 9 U/L (ref 0–35)
AST: 14 U/L (ref 0–37)
Albumin: 4.5 g/dL (ref 3.5–5.2)
Alkaline Phosphatase: 42 U/L (ref 39–117)
BUN: 24 mg/dL — ABNORMAL HIGH (ref 6–23)
CO2: 28 meq/L (ref 19–32)
Calcium: 9.4 mg/dL (ref 8.4–10.5)
Chloride: 105 meq/L (ref 96–112)
Creatinine, Ser: 0.83 mg/dL (ref 0.40–1.20)
GFR: 74.71 mL/min (ref >60.00)
Glucose, Bld: 90 mg/dL (ref 70–99)
Potassium: 4.7 meq/L (ref 3.5–5.1)
Sodium: 141 meq/L (ref 135–145)
Total Bilirubin: 0.6 mg/dL (ref 0.2–1.2)
Total Protein: 6.7 g/dL (ref 6.0–8.3)

## 2023-05-07 LAB — CBC WITH DIFFERENTIAL/PLATELET
Basophils Absolute: 0 10*3/uL (ref 0.0–0.1)
Basophils Relative: 1.2 % (ref 0.0–3.0)
Eosinophils Absolute: 0.1 10*3/uL (ref 0.0–0.7)
Eosinophils Relative: 1.6 % (ref 0.0–5.0)
HCT: 42.4 % (ref 36.0–46.0)
Hemoglobin: 14.2 g/dL (ref 12.0–15.0)
Lymphocytes Relative: 28.8 % (ref 12.0–46.0)
Lymphs Abs: 1.2 10*3/uL (ref 0.7–4.0)
MCHC: 33.5 g/dL (ref 30.0–36.0)
MCV: 97.1 fL (ref 78.0–100.0)
Monocytes Absolute: 0.3 10*3/uL (ref 0.1–1.0)
Monocytes Relative: 7.6 % (ref 3.0–12.0)
Neutro Abs: 2.5 10*3/uL (ref 1.4–7.7)
Neutrophils Relative %: 60.8 % (ref 43.0–77.0)
Platelets: 189 10*3/uL (ref 150.0–400.0)
RBC: 4.37 Mil/uL (ref 3.87–5.11)
RDW: 13.2 % (ref 11.5–15.5)
WBC: 4 10*3/uL (ref 4.0–10.5)

## 2023-05-07 LAB — TSH: TSH: 5.06 u[IU]/mL (ref 0.35–5.50)

## 2023-05-07 LAB — LIPID PANEL
Cholesterol: 233 mg/dL — ABNORMAL HIGH (ref 0–200)
HDL: 67.9 mg/dL (ref >39.00)
LDL Cholesterol: 147 mg/dL — ABNORMAL HIGH (ref 0–99)
NonHDL: 165.4
Total CHOL/HDL Ratio: 3
Triglycerides: 90 mg/dL (ref 0.0–149.0)
VLDL: 18 mg/dL (ref 0.0–40.0)

## 2023-05-07 LAB — VITAMIN D 25 HYDROXY (VIT D DEFICIENCY, FRACTURES): VITD: 43.41 ng/mL (ref 30.00–100.00)

## 2023-05-07 MED ORDER — FAMOTIDINE 40 MG PO TABS: 40.0000 mg | ORAL_TABLET | Freq: Every day | ORAL | 2 refills | Status: DC

## 2023-05-07 MED ORDER — ALENDRONATE SODIUM 70 MG PO TABS
70.0000 mg | ORAL_TABLET | ORAL | 3 refills | Status: DC
Start: 2023-05-07 — End: 2023-05-25

## 2023-05-07 NOTE — Assessment & Plan Note (Signed)
Ghm utd Check labs  See AVS Health Maintenance  Topic Date Due   Cervical Cancer Screening (HPV/Pap Cotest)  09/15/2016   DTaP/Tdap/Td (2 - Tdap) 04/29/2018   COVID-19 Vaccine (1 - 2024-25 season) 05/23/2023 (Originally 12/17/2022)   INFLUENZA VACCINE  07/16/2023 (Originally 11/16/2022)   Zoster Vaccines- Shingrix (1 of 2) 08/05/2023 (Originally 04/26/2009)   MAMMOGRAM  09/28/2023   Colonoscopy  06/05/2024   Hepatitis C Screening  Completed   HIV Screening  Completed   HPV VACCINES  Aged Out

## 2023-05-07 NOTE — Progress Notes (Signed)
Established Patient Office Visit  Subjective   Patient ID: Christy Morales, female    DOB: [birth year]  Age: 63 y.o. MRN: [id number]  Chief Complaint  Patient presents with   Annual Exam    Pt states fasting     HPI Discussed the use of AI scribe software for clinical note transcription with the patient, who gave verbal consent to proceed.  History of Present Illness   The patient, with a history of osteoporosis, presents for a routine physical and to discuss concerns about her current medication, Fosamax, which has been causing significant reflux. She expresses interest in switching to an injectable medication, either Prolia or Evenity, as suggested by her previous doctor.  In addition to osteoporosis, the patient has been experiencing tinnitus. She consulted an ENT specialist, who conducted a hearing test and found minor hearing loss, but no other issues. The patient has not pursued further treatment for this issue.  The patient also reports a recent knee injury, which has temporarily disrupted her regular exercise routine. She had a total knee replacement before Thanksgiving and has been experiencing pain since the procedure.  The patient is up-to-date with her mammogram and is due for a colonoscopy. She has a family history of skin cancer and is interested in seeing a dermatologist for a skin check, as she spends a significant amount of time outdoors.  The patient is currently taking Fosamax for osteoporosis and famotidine for reflux. She has three weeks' worth of Fosamax left and is awaiting insurance approval to switch to an injectable medication. She is also considering a pneumonia vaccine but has concerns about potential side effects, given her reaction to the COVID-19 vaccine.      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/14/2016   Family history of breast cancer in first degree relative 03/03/2016   Osteopenia 06/12/2014   Hypercholesterolemia 01/28/2012   History of atrial paroxysmal tachycardia 01/28/2012   Dysphagia 01/26/2012   SHINGLES 04/30/2008   DEGENERATIVE JOINT DISEASE 04/30/2008   Past Medical History:  Diagnosis Date   Complication of anesthesia    DJD (degenerative joint disease)    RIGHT KNEE   Family history of breast cancer    History of shingles    Osteopenia    LOWER SPINE AND RIGHT HIP   Paroxysmal supraventricular tachycardia (HCC)    YRS AGO, NO CAUSE FOUND    PONV (postoperative nausea and vomiting)    Past Surgical History:  Procedure Laterality Date   COLONSCOPY     KNEE ARTHROSCOPY WITH MEDIAL MENISECTOMY Right 03/21/2018   Procedure: RIGHT KNEE ARTHROSCOPY WITH PARTIAL MEDIAL MENISECTOMY, CHONDROPLASTY, ARTHROSCOPIC INTERNAL FIXATION MEDIAL FEMORAL CONDYLE;  Surgeon: Eugenia Mcalpine, MD;  Location: [facility] [place];  Service: Orthopedics;  Laterality: Right;   right knee arthroscopy for meniscus tear     Dr. Rennis Chris   tubal pregnancy with D & C     Social History   Tobacco Use   Smoking status: Never   Smokeless tobacco: Never  Vaping Use   Vaping status: Never Used  Substance Use Topics   Alcohol use: Yes    Alcohol/week: 2.0 standard  drinks of alcohol    Types: 2 Glasses of wine per week    Comment: OCC WINE   Drug use: No   Social History   Socioeconomic History   Marital status: Married    Spouse name: Dr. Marcelyn Bruins   Number of children: Not on file   Years of education: Not on file   Highest education level: Not on file  Occupational History   Occupation: nursing with [employer]  Tobacco Use   Smoking status: Never   Smokeless  tobacco: Never  Vaping Use   Vaping status: Never Used  Substance and Sexual Activity   Alcohol use: Yes    Alcohol/week: 2.0 standard drinks of alcohol    Types: 2 Glasses of wine per week    Comment: OCC WINE   Drug use: No   Sexual activity: Yes    Partners: Male  Other Topics Concern   Not on file  Social History Narrative   Exercise twice a week   Social Drivers of Corporate investment banker Strain: Not on file  Food Insecurity: Not on file  Transportation Needs: Not on file  Physical Activity: Not on file  Stress: Not on file  Social Connections: Not on file  Intimate Partner Violence: Not on file   Family Status  Relation Name Status   Father  Deceased at age 18       esophageal cancer(smoker), hx of hypercholesterolemia   Mother  Deceased at age 36       MI, CABG, hx of breast cancer and osteoporosis   Sister Elnita Maxwell Deceased at age 26       x 3 one with HBP and osteoporosis   Brother Psychologist, counselling, age 52y       x 1   MGM  Deceased at age 42s   Sister Henreitta Cea, age 1y   Sister Mel Alive, age Juliette Alcide  Deceased at age late 36s       suicide   MGF  Deceased at age 23s       failure to thrive   PGM  Deceased at age 15s       pneumonia   PGF  Deceased at age 61s       brain aneurysm   Son Shearon Balo, age 62y   Daughter Laray Anger, age 32y  No partnership data on file   Family History  Problem Relation Age of Onset   High Cholesterol Father    Heart disease Father    Esophageal cancer Father 19       smoker and heavy drinker   Stomach cancer Father 74       smoker and heavy drinker   Breast cancer Mother 68   High Cholesterol Mother    Heart disease Mother    Heart attack Mother        d. 87y   Breast cancer Sister 64       d. 81y; took fertility hormones   Other Sister        hx cervical ablation   Osteoporosis Maternal Grandmother    Osteoporosis Sister    Hyperparathyroidism Sister        adenoma   Cancer Maternal Grandfather         oral cancer dx 88s; smoker and SL tob user   Pneumonia Paternal Grandmother        d. 30s   Aneurysm Paternal Grandfather  brain; d. 42s   Allergies  Allergen Reactions   Penicillins     GI UPSET, DONE OK WITH PENCILLIAN RECENTLY   Sulfamethoxazole-Trimethoprim     REACTION: rash SEVERE BACTRIM      Review of Systems  Constitutional:  Negative for chills, fever and malaise/fatigue.  HENT:  Negative for congestion and hearing loss.   Eyes:  Negative for blurred vision and discharge.  Respiratory:  Negative for cough, sputum production and shortness of breath.   Cardiovascular:  Negative for chest pain, palpitations and leg swelling.  Gastrointestinal:  Negative for abdominal pain, blood in stool, constipation, diarrhea, heartburn, nausea and vomiting.  Genitourinary:  Negative for dysuria, frequency, hematuria and urgency.  Musculoskeletal:  Negative for back pain, falls and myalgias.  Skin:  Negative for rash.  Neurological:  Negative for dizziness, sensory change, loss of consciousness, weakness and headaches.  Endo/Heme/Allergies:  Negative for environmental allergies. Does not bruise/bleed easily.  Psychiatric/Behavioral:  Negative for depression and suicidal ideas. The patient is not nervous/anxious and does not have insomnia.       Objective:     BP 100/80 (BP Location: Right Arm, Patient Position: Sitting, Cuff Size: Normal)   Pulse (!) 56   Temp 97.8 F (36.6 C) (Oral)   Resp 16   Ht 5\' 5"  (1.651 m)   Wt 170 lb 3.2 oz (77.2 kg)   SpO2 99%   BMI 28.32 kg/m  BP Readings from Last 3 Encounters:  05/07/23 100/80  05/04/22 104/70  05/13/19 110/78   Wt Readings from Last 3 Encounters:  05/07/23 170 lb 3.2 oz (77.2 kg)  05/04/22 172 lb 6.4 oz (78.2 kg)  05/13/19 163 lb 12.8 oz (74.3 kg)   SpO2 Readings from Last 3 Encounters:  05/07/23 99%  05/04/22 98%  05/13/19 98%      Physical Exam Vitals and nursing note reviewed.  Constitutional:       General: She is not in acute distress.    Appearance: Normal appearance. She is well-developed.  HENT:     Head: Normocephalic and atraumatic.     Right Ear: Tympanic membrane, ear canal and external ear normal. There is no impacted cerumen.     Left Ear: Tympanic membrane, ear canal and external ear normal. There is no impacted cerumen.     Nose: Nose normal.     Mouth/Throat:     Mouth: Mucous membranes are moist.     Pharynx: Oropharynx is clear. No oropharyngeal exudate or posterior oropharyngeal erythema.  Eyes:     General: No scleral icterus.       Right eye: No discharge.        Left eye: No discharge.     Conjunctiva/sclera: Conjunctivae normal.     Pupils: Pupils are equal, round, and reactive to light.  Neck:     Thyroid: No thyromegaly or thyroid tenderness.     Vascular: No JVD.  Cardiovascular:     Rate and Rhythm: Normal rate and regular rhythm.     Heart sounds: Normal heart sounds. No murmur heard. Pulmonary:     Effort: Pulmonary effort is normal. No respiratory distress.     Breath sounds: Normal breath sounds.  Abdominal:     General: Bowel sounds are normal. There is no distension.     Palpations: Abdomen is soft. There is no mass.     Tenderness: There is no abdominal tenderness. There is no guarding or rebound.  Genitourinary:    Vagina: Normal.  Musculoskeletal:  General: Normal range of motion.     Cervical back: Normal range of motion and neck supple.     Right lower leg: No edema.     Left lower leg: No edema.  Lymphadenopathy:     Cervical: No cervical adenopathy.  Skin:    General: Skin is warm and dry.     Findings: No erythema or rash.  Neurological:     Mental Status: She is alert and oriented to person, place, and time.     Cranial Nerves: No cranial nerve deficit.     Deep Tendon Reflexes: Reflexes are normal and symmetric.  Psychiatric:        Mood and Affect: Mood normal.        Behavior: Behavior normal.        Thought  Content: Thought content normal.        Judgment: Judgment normal.      No results found for any visits on 05/07/23.  Last CBC Lab Results  Component Value Date   WBC 3.6 (L) 05/11/2022   HGB 14.1 05/11/2022   HCT 41.0 05/11/2022   MCV 94.7 05/11/2022   MCH 31.8 05/28/2017   RDW 12.9 05/11/2022   PLT 196.0 05/11/2022   Last metabolic panel Lab Results  Component Value Date   GLUCOSE 93 05/11/2022   NA 140 05/11/2022   K 4.2 05/11/2022   CL 105 05/11/2022   CO2 28 05/11/2022   BUN 17 05/11/2022   CREATININE 0.84 05/11/2022   GFR 74.15 05/11/2022   CALCIUM 9.1 05/11/2022   PROT 6.7 05/11/2022   ALBUMIN 4.2 05/11/2022   BILITOT 0.7 05/11/2022   ALKPHOS 38 (L) 05/11/2022   AST 14 05/11/2022   ALT 6 05/11/2022   Last lipids Lab Results  Component Value Date   CHOL 228 (H) 05/11/2022   HDL 66.60 05/11/2022   LDLCALC 148 (H) 05/11/2022   LDLDIRECT 143.9 01/25/2012   TRIG 71.0 05/11/2022   CHOLHDL 3 05/11/2022   Last hemoglobin A1c No results found for: "HGBA1C" Last thyroid functions Lab Results  Component Value Date   TSH 3.21 08/16/2022   T4TOTAL 6.9 08/16/2022   Last vitamin D Lab Results  Component Value Date   VD25OH 35.11 05/11/2022   Last vitamin B12 and Folate Lab Results  Component Value Date   VITAMINB12 285 05/11/2022      The 10-year ASCVD risk score (Arnett DK, et al., 2019) is: 3.1%    Assessment & Plan:   Problem List Items Addressed This Visit       Unprioritized   Osteoporosis without current pathological fracture   Relevant Medications   alendronate (FOSAMAX) 70 MG tablet   Other Relevant Orders   Comprehensive metabolic panel   VITAMIN D 25 Hydroxy (Vit-D Deficiency, Fractures)   DG Bone Density   Preventative health care - Primary   Ghm utd Check labs  See AVS Health Maintenance  Topic Date Due   Cervical Cancer Screening (HPV/Pap Cotest)  09/15/2016   DTaP/Tdap/Td (2 - Tdap) 04/29/2018   COVID-19 Vaccine (1 -  2024-25 season) 05/23/2023 (Originally 12/17/2022)   INFLUENZA VACCINE  07/16/2023 (Originally 11/16/2022)   Zoster Vaccines- Shingrix (1 of 2) 08/05/2023 (Originally 04/26/2009)   MAMMOGRAM  09/28/2023   Colonoscopy  06/05/2024   Hepatitis C Screening  Completed   HIV Screening  Completed   HPV VACCINES  Aged Out         Relevant Orders   CBC with Differential/Platelet   Comprehensive  metabolic panel   Lipid panel   TSH   VITAMIN D 25 Hydroxy (Vit-D Deficiency, Fractures)   Other Visit Diagnoses       Family history of nonmelanoma skin cancer       Relevant Orders   Ambulatory referral to Dermatology     Assessment and Plan    Osteoporosis Severe reflux with Fosamax was reported. Alternative treatments, Prolia (denosumab) and Evenity (romosozumab), were discussed. Prolia, administered biannually, is preferred due to less frequent dosing and good tolerance, despite a slightly increased but not clinically significant risk of breast cancer. Evenity requires monthly visits for a year and builds bone. Side effects include joint pain, rare pancreatitis, and changes in calcium, magnesium, and phosphorus levels. Insurance approval is needed for Prolia. The decision was made to start Prolia and reassess based on bone density results. A bone density scan is ordered, and insurance authorization for Prolia will be initiated. Fosamax and famotidine are refilled for three weeks.  General Health Maintenance Mammogram and eye exams are up-to-date. Regular dermatological surveys were emphasized due to sun exposure, and a referral to Dr. Onalee Hua, a dermatologist, was made. Shingles and pneumonia vaccines were declined due to concerns about adverse reactions. Severe joint pain after the second COVID vaccine has led to vaccine hesitancy. Labs for calcium, magnesium, and phosphorus are ordered. A nurse visit for the Prolia injection will be scheduled upon insurance approval.  Follow-up Follow up with Dr.  Marcelle Overlie for a Pap smear and colonoscopy is planned. Ensure mammogram records are sent to primary care.        Return in about 1 year (around 05/06/2024) for fasting, annual exam.    Donato Schultz, DO

## 2023-05-07 NOTE — Telephone Encounter (Signed)
Per Dr. Laury Axon, patient would like to try Prolia.

## 2023-05-07 NOTE — Patient Instructions (Signed)
Preventive Care 64-64 Years Old, Female Preventive care refers to lifestyle choices and visits with your health care provider that can promote health and wellness. Preventive care visits are also called wellness exams. What can I expect for my preventive care visit? Counseling Your health care provider may ask you questions about your: Medical history, including: Past medical problems. Family medical history. Pregnancy history. Current health, including: Menstrual cycle. Method of birth control. Emotional well-being. Home life and relationship well-being. Sexual activity and sexual health. Lifestyle, including: Alcohol, nicotine or tobacco, and drug use. Access to firearms. Diet, exercise, and sleep habits. Work and work Astronomer. Sunscreen use. Safety issues such as seatbelt and bike helmet use. Physical exam Your health care provider will check your: Height and weight. These may be used to calculate your BMI (body mass index). BMI is a measurement that tells if you are at a healthy weight. Waist circumference. This measures the distance around your waistline. This measurement also tells if you are at a healthy weight and may help predict your risk of certain diseases, such as type 2 diabetes and high blood pressure. Heart rate and blood pressure. Body temperature. Skin for abnormal spots. What immunizations do I need?  Vaccines are usually given at various ages, according to a schedule. Your health care provider will recommend vaccines for you based on your age, medical history, and lifestyle or other factors, such as travel or where you work. What tests do I need? Screening Your health care provider may recommend screening tests for certain conditions. This may include: Lipid and cholesterol levels. Diabetes screening. This is done by checking your blood sugar (glucose) after you have not eaten for a while (fasting). Pelvic exam and Pap test. Hepatitis B test. Hepatitis C  test. HIV (human immunodeficiency virus) test. STI (sexually transmitted infection) testing, if you are at risk. Lung cancer screening. Colorectal cancer screening. Mammogram. Talk with your health care provider about when you should start having regular mammograms. This may depend on whether you have a family history of breast cancer. BRCA-related cancer screening. This may be done if you have a family history of breast, ovarian, tubal, or peritoneal cancers. Bone density scan. This is done to screen for osteoporosis. Talk with your health care provider about your test results, treatment options, and if necessary, the need for more tests. Follow these instructions at home: Eating and drinking  Eat a diet that includes fresh fruits and vegetables, whole grains, lean protein, and low-fat dairy products. Take vitamin and mineral supplements as recommended by your health care provider. Do not drink alcohol if: Your health care provider tells you not to drink. You are pregnant, may be pregnant, or are planning to become pregnant. If you drink alcohol: Limit how much you have to 0-1 drink a day. Know how much alcohol is in your drink. In the [place] one drink equals one 12 oz bottle of beer (355 mL), one 5 oz glass of wine (148 mL), or one 1 oz glass of hard liquor (44 mL). Lifestyle Brush your teeth every morning and night with fluoride toothpaste. Floss one time each day. Exercise for at least 30 minutes 5 or more days each week. Do not use any products that contain nicotine or tobacco. These products include cigarettes, chewing tobacco, and vaping devices, such as e-cigarettes. If you need help quitting, ask your health care provider. Do not use drugs. If you are sexually active, practice safe sex. Use a condom or other form of protection to  prevent STIs. If you do not wish to become pregnant, use a form of birth control. If you plan to become pregnant, see your health care provider for a  prepregnancy visit. Take aspirin only as told by your health care provider. Make sure that you understand how much to take and what form to take. Work with your health care provider to find out whether it is safe and beneficial for you to take aspirin daily. Find healthy ways to manage stress, such as: Meditation, yoga, or listening to music. Journaling. Talking to a trusted person. Spending time with friends and family. Minimize exposure to UV radiation to reduce your risk of skin cancer. Safety Always wear your seat belt while driving or riding in a vehicle. Do not drive: If you have been drinking alcohol. Do not ride with someone who has been drinking. When you are tired or distracted. While texting. If you have been using any mind-altering substances or drugs. Wear a helmet and other protective equipment during sports activities. If you have firearms in your house, make sure you follow all gun safety procedures. Seek help if you have been physically or sexually abused. What's next? Visit your health care provider once a year for an annual wellness visit. Ask your health care provider how often you should have your eyes and teeth checked. Stay up to date on all vaccines. This information is not intended to replace advice given to you by your health care provider. Make sure you discuss any questions you have with your health care provider. Document Revised: 09/29/2020 Document Reviewed: 09/29/2020 Elsevier Patient Education  2024 Elsevier Inc.Denosumab Injection (Osteoporosis) What is this medication? DENOSUMAB (den oh SUE mab) prevents and treats osteoporosis. It works by Interior and spatial designer stronger and less likely to break (fracture). It is a monoclonal antibody. This medicine may be used for other purposes; ask your health care provider or pharmacist if you have questions. COMMON BRAND NAME(S): Prolia What should I tell my care team before I take this medication? They need to know  if you have any of these conditions: Dental or gum disease Had thyroid or parathyroid (glands located in neck) surgery Having dental surgery or a tooth pulled Kidney disease Low levels of calcium in the blood On dialysis Poor nutrition Thyroid disease Trouble absorbing nutrients from your food An unusual or allergic reaction to denosumab, other medications, foods, dyes, or preservatives Pregnant or trying to get pregnant Breastfeeding How should I use this medication? This medication is injected under the skin. It is given by your care team in a hospital or clinic setting. A special MedGuide will be given to you before each treatment. Be sure to read this information carefully each time. Talk to your care team about the use of this medication in children. Special care may be needed. Overdosage: If you think you have taken too much of this medicine contact a poison control center or emergency room at once. NOTE: This medicine is only for you. Do not share this medicine with others. What if I miss a dose? Keep appointments for follow-up doses. It is important not to miss your dose. Call your care team if you are unable to keep an appointment. What may interact with this medication? Do not take this medication with any of the following: Other medications that contain denosumab This medication may also interact with the following: Medications that lower your chance of fighting infection Steroid medications, such as prednisone or cortisone This list may not describe all  possible interactions. Give your health care provider a list of all the medicines, herbs, non-prescription drugs, or dietary supplements you use. Also tell them if you smoke, drink alcohol, or use illegal drugs. Some items may interact with your medicine. What should I watch for while using this medication? Your condition will be monitored carefully while you are receiving this medication. You may need blood work done while  taking this medication. This medication may increase your risk of getting an infection. Call your care team for advice if you get a fever, chills, sore throat, or other symptoms of a cold or flu. Do not treat yourself. Try to avoid being around people who are sick. Tell your dentist and dental surgeon that you are taking this medication. You should not have major dental surgery while on this medication. See your dentist to have a dental exam and fix any dental problems before starting this medication. Take good care of your teeth while on this medication. Make sure you see your dentist for regular follow-up appointments. This medication may cause low levels of calcium in your body. The risk of severe side effects is increased in people with kidney disease. Your care team may prescribe calcium and vitamin D to help prevent low calcium levels while you take this medication. It is important to take calcium and vitamin D as directed by your care team. Talk to your care team if you may be pregnant. Serious birth defects may occur if you take this medication during pregnancy and for 5 months after the last dose. You will need a negative pregnancy test before starting this medication. Contraception is recommended while taking this medication and for 5 months after the last dose. Your care team can help you find the option that works for you. Talk to your care team before breastfeeding. Changes to your treatment plan may be needed. What side effects may I notice from receiving this medication? Side effects that you should report to your care team as soon as possible: Allergic reactions--skin rash, itching, hives, swelling of the face, lips, tongue, or throat Infection--fever, chills, cough, sore throat, wounds that don't heal, pain or trouble when passing urine, general feeling of discomfort or being unwell Low calcium level--muscle pain or cramps, confusion, tingling, or numbness in the hands or  feet Osteonecrosis of the jaw--pain, swelling, or redness in the mouth, numbness of the jaw, poor healing after dental work, unusual discharge from the mouth, visible bones in the mouth Severe bone, joint, or muscle pain Skin infection--skin redness, swelling, warmth, or pain Side effects that usually do not require medical attention (report these to your care team if they continue or are bothersome): Back pain Headache Joint pain Muscle pain Pain in the hands, arms, legs, or feet Runny or stuffy nose Sore throat This list may not describe all possible side effects. Call your doctor for medical advice about side effects. You may report side effects to FDA at 1-800-FDA-1088. Where should I keep my medication? This medication is given in a hospital or clinic. It will not be stored at home. NOTE: This sheet is a summary. It may not cover all possible information. If you have questions about this medicine, talk to your doctor, pharmacist, or health care provider.  2024 Elsevier/Gold Standard (2022-05-09 00:00:00)

## 2023-05-08 DIAGNOSIS — H2513 Age-related nuclear cataract, bilateral: Secondary | ICD-10-CM | POA: Diagnosis not present

## 2023-05-08 DIAGNOSIS — H31103 Choroidal degeneration, unspecified, bilateral: Secondary | ICD-10-CM | POA: Diagnosis not present

## 2023-05-08 DIAGNOSIS — H04123 Dry eye syndrome of bilateral lacrimal glands: Secondary | ICD-10-CM | POA: Diagnosis not present

## 2023-05-08 DIAGNOSIS — H43813 Vitreous degeneration, bilateral: Secondary | ICD-10-CM | POA: Diagnosis not present

## 2023-05-08 NOTE — Telephone Encounter (Addendum)
CAM order placed 05/07/23

## 2023-05-10 NOTE — Telephone Encounter (Signed)
New start for prolia.  We put CAM order in on 05/07/23.  Checking status.

## 2023-05-12 ENCOUNTER — Encounter: Payer: Self-pay | Admitting: Family Medicine

## 2023-05-14 ENCOUNTER — Telehealth: Payer: Self-pay

## 2023-05-14 ENCOUNTER — Other Ambulatory Visit (HOSPITAL_COMMUNITY): Payer: Self-pay

## 2023-05-14 NOTE — Telephone Encounter (Signed)
Prolia BIV in separate encounter.

## 2023-05-14 NOTE — Telephone Encounter (Signed)
Prolia VOB initiated via [email or web address]

## 2023-05-15 ENCOUNTER — Ambulatory Visit
Admission: RE | Admit: 2023-05-15 | Discharge: 2023-05-15 | Disposition: A | Discharge status: Home / Self Care | Payer: Federal, State, Local not specified - PPO | Source: Ambulatory Visit | Attending: Family Medicine | Admitting: Family Medicine

## 2023-05-15 DIAGNOSIS — N958 Other specified menopausal and perimenopausal disorders: Secondary | ICD-10-CM | POA: Diagnosis not present

## 2023-05-15 DIAGNOSIS — M8588 Other specified disorders of bone density and structure, other site: Secondary | ICD-10-CM | POA: Diagnosis not present

## 2023-05-15 DIAGNOSIS — M810 Age-related osteoporosis without current pathological fracture: Secondary | ICD-10-CM

## 2023-05-15 DIAGNOSIS — E2839 Other primary ovarian failure: Secondary | ICD-10-CM | POA: Diagnosis not present

## 2023-05-16 ENCOUNTER — Encounter: Payer: Self-pay | Admitting: Family Medicine

## 2023-05-17 NOTE — Telephone Encounter (Signed)
Christy Morales

## 2023-05-21 ENCOUNTER — Other Ambulatory Visit: Payer: Self-pay | Admitting: Family Medicine

## 2023-05-21 DIAGNOSIS — M810 Age-related osteoporosis without current pathological fracture: Secondary | ICD-10-CM

## 2023-05-21 MED ORDER — IBANDRONATE SODIUM 150 MG PO TABS: 150.0000 mg | ORAL_TABLET | ORAL | 3 refills | Status: DC

## 2023-05-23 ENCOUNTER — Other Ambulatory Visit (HOSPITAL_COMMUNITY): Payer: Self-pay

## 2023-05-23 NOTE — Telephone Encounter (Signed)
 Pharmacy Patient Advocate Encounter   Received notification from  [employer] Portal that prior authorization for PROLIA is required/requested.   Insurance verification completed.   The patient is insured through [facility] .   Per test claim: PA required; PA submitted to above mentioned insurance via CoverMyMeds Key/confirmation #/[id number] Status is pending

## 2023-05-24 ENCOUNTER — Encounter: Payer: Self-pay | Admitting: Dermatology

## 2023-05-24 ENCOUNTER — Ambulatory Visit: Payer: Federal, State, Local not specified - PPO | Admitting: Dermatology

## 2023-05-24 VITALS — BP 116/78 | HR 68

## 2023-05-24 DIAGNOSIS — W908XXA Exposure to other nonionizing radiation, initial encounter: Secondary | ICD-10-CM

## 2023-05-24 DIAGNOSIS — L570 Actinic keratosis: Secondary | ICD-10-CM

## 2023-05-24 DIAGNOSIS — L853 Xerosis cutis: Secondary | ICD-10-CM | POA: Diagnosis not present

## 2023-05-24 DIAGNOSIS — L814 Other melanin hyperpigmentation: Secondary | ICD-10-CM | POA: Diagnosis not present

## 2023-05-24 DIAGNOSIS — L578 Other skin changes due to chronic exposure to nonionizing radiation: Secondary | ICD-10-CM

## 2023-05-24 DIAGNOSIS — Z7189 Other specified counseling: Secondary | ICD-10-CM

## 2023-05-24 DIAGNOSIS — Z1283 Encounter for screening for malignant neoplasm of skin: Secondary | ICD-10-CM | POA: Diagnosis not present

## 2023-05-24 DIAGNOSIS — L821 Other seborrheic keratosis: Secondary | ICD-10-CM

## 2023-05-24 DIAGNOSIS — D1801 Hemangioma of skin and subcutaneous tissue: Secondary | ICD-10-CM

## 2023-05-24 DIAGNOSIS — D229 Melanocytic nevi, unspecified: Secondary | ICD-10-CM

## 2023-05-24 MED ORDER — FLUOROURACIL 5 % EX CREA
TOPICAL_CREAM | Freq: Two times a day (BID) | CUTANEOUS | 0 refills | Status: AC
Start: 2023-05-24 — End: 2023-06-07

## 2023-05-24 NOTE — Patient Instructions (Addendum)
 Important Information  Due to recent changes in healthcare laws, you may see results of your pathology and/or laboratory studies on MyChart before the doctors have had a chance to review them. We understand that in some cases there may be results that are confusing or concerning to you. Please understand that not all results are received at the same time and often the doctors may need to interpret multiple results in order to provide you with the best plan of care or course of treatment. Therefore, we ask that you please give us  2 business days to thoroughly review all your results before contacting the office for clarification. Should we see a critical lab result, you will be contacted sooner.   If You Need Anything After Your Visit  If you have any questions or concerns for your doctor, please call our main line at [phone] If no one answers, please leave a voicemail as directed and we will return your call as soon as possible. Messages left after 4 pm will be answered the following business day.   You may also send us  a message via MyChart. We typically respond to MyChart messages within 1-2 business days.  For prescription refills, please ask your pharmacy to contact our office. Our fax number is [phone].  If you have an urgent issue when the clinic is closed that cannot wait until the next business day, you can page your doctor at the number below.    Please note that while we do our best to be available for urgent issues outside of office hours, we are not available 24/7.   If you have an urgent issue and are unable to reach us , you may choose to seek medical care at your doctor's office, retail clinic, urgent care center, or emergency room.  If you have a medical emergency, please immediately call 911 or go to the emergency department. In the event of inclement weather, please call our main line at [phone] for an update on the status of any delays or  closures.  Dermatology Medication Tips: Please keep the boxes that topical medications come in in order to help keep track of the instructions about where and how to use these. Pharmacies typically print the medication instructions only on the boxes and not directly on the medication tubes.   If your medication is too expensive, please contact our office at [phone] or send us  a message through MyChart.   We are unable to tell what your co-pay for medications will be in advance as this is different depending on your insurance coverage. However, we may be able to find a substitute medication at lower cost or fill out paperwork to get insurance to cover a needed medication.   If a prior authorization is required to get your medication covered by your insurance company, please allow us  1-2 business days to complete this process.  Drug prices often vary depending on where the prescription is filled and some pharmacies may offer cheaper prices.  The website www.goodrx.com contains coupons for medications through different pharmacies. The prices here do not account for what the cost may be with help from insurance (it may be cheaper with your insurance), but the website can give you the price if you did not use any insurance.  - You can print the associated coupon and take it with your prescription to the pharmacy.  - You may also stop by our office during regular business hours and pick up a GoodRx coupon card.  - If  you need your prescription sent electronically to a different pharmacy, notify our office through [facility] or by phone at [phone]    Skin Education :   I counseled the patient regarding the following: Sun screen (SPF 30 or greater) should be applied during peak UV exposure (between 10am and 2pm) and reapplied after exercise or swimming.  The ABCDEs of melanoma were reviewed with the patient, and the importance of monthly self-examination of moles was emphasized.  Should any moles change in shape or color, or itch, bleed or burn, pt will contact our office for evaluation sooner then their interval appointment.  Plan: Sunscreen Recommendations I recommended a broad spectrum sunscreen with a SPF of 30 or higher. I explained that SPF 30 sunscreens block approximately 97 percent of the sun's harmful rays. Sunscreens should be applied at least 15 minutes prior to expected sun exposure and then every 2 hours after that as long as sun exposure continues. If swimming or exercising sunscreen should be reapplied every 45 minutes to an hour after getting wet or sweating. One ounce, or the equivalent of a shot glass full of sunscreen, is adequate to protect the skin not covered by a bathing suit. I also recommended a lip balm with a sunscreen as well. Sun protective clothing can be used in lieu of sunscreen but must be worn the entire time you are exposed to the sun's rays. - Start 5-fluorouracil  cream twice a day for 14 days to affected areas including noseLiquid nitrogen was applied for 10-12 seconds to the skin lesion and the expected blistering or scabbing reaction explained. Do not pick at the area. Patient reminded to expect hypopigmented scars from the procedure. Return if lesion fails to fully resolve. For areas treated with Liquid Nitrogen:  Keep clean with soap and water.  Apply Vaseline or Aquaphor twice daily. .  Reviewed course of treatment and expected reaction.  Patient advised to expect inflammation and crusting and advised that erosions are possible.  Patient advised to be diligent with sun protection during and after treatment. Handout with details of how to apply medication and what to expect provided. Counseled to keep medication out of reach of children and pets.

## 2023-05-24 NOTE — Progress Notes (Signed)
 New Patient Visit   Subjective  Christy Morales is a 63 y.o. female who presents for the following: Skin Cancer Screening and Full Body Skin Exam. No hx or family hx of skin cancer.  The patient presents for Total-Body Skin Exam (TBSE) for skin cancer screening and mole check. The patient has spots, moles and lesions to be evaluated, some may be new or changing.  The following portions of the chart were reviewed this encounter and updated as appropriate: medications, allergies, medical history  Review of Systems:  No other skin or systemic complaints except as noted in HPI or Assessment and Plan.  Objective  Well appearing patient in no apparent distress; mood and affect are within normal limits.  A full examination was performed including scalp, head, eyes, ears, nose, lips, neck, chest, axillae, abdomen, back, buttocks, bilateral upper extremities, bilateral lower extremities, hands, feet, fingers, toes, fingernails, and toenails. All findings within normal limits unless otherwise noted below.   Relevant physical exam findings are noted in the Assessment and Plan.    Assessment & Plan   SKIN CANCER SCREENING PERFORMED TODAY.  ACTINIC DAMAGE - Chronic condition, secondary to cumulative UV/sun exposure - diffuse scaly erythematous macules with underlying dyspigmentation - Recommend daily broad spectrum sunscreen SPF 30+ to sun-exposed areas, reapply every 2 hours as needed.  - Staying in the shade or wearing long sleeves, sun glasses (UVA+UVB protection) and wide brim hats (4-inch brim around the entire circumference of the hat) are also recommended for sun protection.  - Call for new or changing lesions.  LENTIGINES, SEBORRHEIC KERATOSES, HEMANGIOMAS - Benign normal skin lesions - Benign-appearing - Call for any changes  MELANOCYTIC NEVI - Tan-brown and/or pink-flesh-colored symmetric macules and papules - Benign appearing on exam today - Observation - Call clinic for new or  changing moles - Recommend daily use of broad spectrum spf 30+ sunscreen to sun-exposed areas.   ACTINIC KERATOSIS Exam: Erythematous thin papules/macules with gritty scale at the nose and bilateral jawline  Actinic keratoses are precancerous spots that appear secondary to cumulative UV radiation exposure/sun exposure over time. They are chronic with expected duration over 1 year. A portion of actinic keratoses will progress to squamous cell carcinoma of the skin. It is not possible to reliably predict which spots will progress to skin cancer and so treatment is recommended to prevent development of skin cancer.  Recommend daily broad spectrum sunscreen SPF 30+ to sun-exposed areas, reapply every 2 hours as needed.  Recommend staying in the shade or wearing long sleeves, sun glasses (UVA+UVB protection) and wide brim hats (4-inch brim around the entire circumference of the hat). Call for new or changing lesions.  Treatment Plan: Start 5-fluorouracil  cream twice a day for 14 days to affected areas including nose and jawline.  Reviewed course of treatment and expected reaction.  Patient advised to expect inflammation and crusting and advised that erosions are possible.  Patient advised to be diligent with sun protection during and after treatment. Handout with details of how to apply medication and what to expect provided. Counseled to keep medication out of reach of children and pets.  Reviewed course of treatment and expected reaction.  Patient advised to expect inflammation and crusting and advised that erosions are possible.  Patient advised to be diligent with sun protection during and after treatment. Handout with details of how to apply medication and what to expect provided. Counseled to keep medication out of reach of children and pets.elpidio  Xerosis- Chronic Condition,  with acute flaring, not at treatment goal - diffuse xerotic patches - recommend gentle, hydrating skin care - gentle skin  care handout given  AK (ACTINIC KERATOSIS) (2) Head - Anterior (Face), Nose  Return in about 3 months (around 08/21/2023) for a/k f/u.  LILLETTE Berwyn Baseman, Surg Tech III, am acting as scribe for RUFUS CHRISTELLA HOLY, MD.   Documentation: I have reviewed the above documentation for accuracy and completeness, and I agree with the above.  RUFUS CHRISTELLA HOLY, MD

## 2023-05-25 ENCOUNTER — Other Ambulatory Visit: Payer: Self-pay | Admitting: *Deleted

## 2023-05-25 ENCOUNTER — Other Ambulatory Visit (HOSPITAL_COMMUNITY): Payer: Self-pay

## 2023-05-25 NOTE — Telephone Encounter (Signed)
 Pharmacy Patient Advocate Encounter  Received notification from [facility] that Prior Authorization for PROLIA has been APPROVED from 05/24/23 to 05/21/24. Ran test claim, Copay is $514.13. This test claim was processed through [facility]- copay amounts may vary at other pharmacies due to pharmacy/plan contracts, or as the patient moves through the different stages of their insurance plan.   PA #/Case ID/Reference #: [id number]

## 2023-05-25 NOTE — Telephone Encounter (Signed)
 Pt ready for scheduling for PROLIA on or after : 05/25/23  Out-of-pocket cost due at time of visit: $267.50 + $336.68 (DEDUCTIBLE)  Number of injection/visits approved: 2  Primary: [id number] Prolia co-insurance: 15% Admin fee co-insurance: 15%  Secondary: --- Prolia co-insurance:  Admin fee co-insurance:   Medical Benefit Details: Date Benefits were checked: 05/14/23 Deductible: $13.32 Met of $350 Required/ Coinsurance: 15%/ Admin Fee: 15%  Prior Auth: APPROVED PA# [id number] Expiration Date: 05/24/23-05/21/24  # of doses approved: 2  Pharmacy benefit: Copay $514.13 If patient wants fill through the pharmacy benefit please send prescription to:  [facility] , and include estimated need by date in rx notes. Pharmacy will ship medication directly to the office.  Patient NOT eligible for Prolia Copay Card. Copay Card can make patient's cost as little as $25. Link to apply: [email or web address]  ** This summary of benefits is an estimation of the patient's out-of-pocket cost. Exact cost may very based on individual plan coverage.

## 2023-05-28 ENCOUNTER — Other Ambulatory Visit (HOSPITAL_COMMUNITY): Payer: Self-pay

## 2023-07-23 ENCOUNTER — Ambulatory Visit: Payer: Federal, State, Local not specified - PPO | Admitting: Dermatology

## 2023-08-21 ENCOUNTER — Ambulatory Visit: Admitting: Dermatology

## 2023-09-13 ENCOUNTER — Encounter: Payer: Self-pay | Admitting: Dermatology

## 2023-09-13 ENCOUNTER — Ambulatory Visit: Admitting: Dermatology

## 2023-09-13 DIAGNOSIS — L570 Actinic keratosis: Secondary | ICD-10-CM

## 2023-09-13 DIAGNOSIS — W908XXA Exposure to other nonionizing radiation, initial encounter: Secondary | ICD-10-CM | POA: Diagnosis not present

## 2023-09-13 DIAGNOSIS — I781 Nevus, non-neoplastic: Secondary | ICD-10-CM

## 2023-09-13 DIAGNOSIS — L579 Skin changes due to chronic exposure to nonionizing radiation, unspecified: Secondary | ICD-10-CM | POA: Diagnosis not present

## 2023-09-13 DIAGNOSIS — Z872 Personal history of diseases of the skin and subcutaneous tissue: Secondary | ICD-10-CM | POA: Diagnosis not present

## 2023-09-13 DIAGNOSIS — L578 Other skin changes due to chronic exposure to nonionizing radiation: Secondary | ICD-10-CM

## 2023-09-13 NOTE — Progress Notes (Unsigned)
   Follow-Up Visit   Subjective  Christy Morales is a 63 y.o. female who presents for the following: follow up on efudex  used to the cheek and nose for 2 weeks. Patient reports that she did have redness after applying.    The following portions of the chart were reviewed this encounter and updated as appropriate: medications, allergies, medical history  Review of Systems:  No other skin or systemic complaints except as noted in HPI or Assessment and Plan.  Objective  Well appearing patient in no apparent distress; mood and affect are within normal limits.  A focused examination was performed of the following areas: face  Relevant exam findings are noted in the Assessment and Plan.    Assessment & Plan   HISTORY OF PRECANCEROUS ACTINIC KERATOSIS - site(s) of PreCancerous Actinic Keratosis clear today. - these may recur and new lesions may form requiring treatment to prevent transformation into skin cancer - observe for new or changing spots and contact [facility] for appointment if occur - photoprotection with sun protective clothing; sunglasses and broad spectrum sunscreen with SPF of at least 30 + and frequent self skin exams recommended - yearly exams by a dermatologist recommended for persons with history of PreCancerous Actinic Keratoses   TELANGIECTASIA Exam: dilated blood vessel(s)  Treatment Plan: Benign appearing on exam Call for changes  Discussed that PDL laser would be the treatment of choice if desired.     Return in about 1 year (around 09/12/2024) for skin exam.  I, Maryan Smalling, RN, am acting as scribe for Deneise Finlay, MD .   Documentation: I have reviewed the above documentation for accuracy and completeness, and I agree with the above.  Deneise Finlay, MD

## 2023-09-13 NOTE — Patient Instructions (Addendum)
 DAY ROUTINE: Moisturizer, Vitamin C, and sunscreen NIGHTTIME ROUTINE: Apply retinol/ retinoid first and then moisturizer on top.  Retinol is an over the counter medication that  can be used on the face to reduce wrinkles. Use a pea sized amount in 5 sections over the entire face. Apply a moisturizer after. Use every other or every 3rd night while skin is getting used to the treatment.   P-TIOX from [employer] will prevent wrinkles for 24 hours. This is an over the counter product.   TELANGIECTASIA Exam: dilated blood vessel(s)  Treatment Plan: Benign appearing on exam Call for changes  -PDL laser would be the treatment of choice, but is considered cosmetic  Some Recommended Sunscreens for Sensitive Skin Include:  Body or All Over Sunscreen (water resistant) EltaMD UV Pure Blue lizard sensitive Sun bum mineral (avoid if sensitive to scent) Aveeno Positively Mineral Neutrogena sheer zinc (Slightly harder to rub in) [employer] clear zinc (Slightly harder to rub in) Vanicream mineral sunscreen spf 50+  Face Sunscreen EltaMD UV Elements (tinted) EltaMD UV Restore (tinted or nontinted) EltaMD UV Physical (tinted) CeraVe hydrating sunscreen 50 face (tinted or nontinted) [employer]cience Sunforgettable Total Protection Face Shield (good for most skin tones) La Roche Posay Mineral Tinted Cotz Flawless Complexion   Powder Sunscreen (Nice for reapplying or applying on the go) [employer]cience Sunforgettable Total Protection Brush on Shield (available in different tints)  Important Information  Due to recent changes in healthcare laws, you may see results of your pathology and/or laboratory studies on MyChart before the doctors have had a chance to review them. We understand that in some cases there may be results that are confusing or concerning to you. Please understand that not all results are received at the same time and often the doctors may need to interpret multiple results in order to  provide you with the best plan of care or course of treatment. Therefore, we ask that you please give us  2 business days to thoroughly review all your results before contacting the office for clarification. Should we see a critical lab result, you will be contacted sooner.   If You Need Anything After Your Visit  If you have any questions or concerns for your doctor, please call our main line at [phone] If no one answers, please leave a voicemail as directed and we will return your call as soon as possible. Messages left after 4 pm will be answered the following business day.   You may also send us  a message via MyChart. We typically respond to MyChart messages within 1-2 business days.  For prescription refills, please ask your pharmacy to contact our office. Our fax number is [phone].  If you have an urgent issue when the clinic is closed that cannot wait until the next business day, you can page your doctor at the number below.    Please note that while we do our best to be available for urgent issues outside of office hours, we are not available 24/7.   If you have an urgent issue and are unable to reach us , you may choose to seek medical care at your doctor's office, retail clinic, urgent care center, or emergency room.  If you have a medical emergency, please immediately call 911 or go to the emergency department. In the event of inclement weather, please call our main line at [phone] for an update on the status of any delays or closures.  Dermatology Medication Tips: Please keep the boxes that topical medications come in  in order to help keep track of the instructions about where and how to use these. Pharmacies typically print the medication instructions only on the boxes and not directly on the medication tubes.   If your medication is too expensive, please contact our office at [phone] or send us  a message through MyChart.   We are unable to tell what your  co-pay for medications will be in advance as this is different depending on your insurance coverage. However, we may be able to find a substitute medication at lower cost or fill out paperwork to get insurance to cover a needed medication.   If a prior authorization is required to get your medication covered by your insurance company, please allow us  1-2 business days to complete this process.  Drug prices often vary depending on where the prescription is filled and some pharmacies may offer cheaper prices.  The website www.goodrx.com contains coupons for medications through different pharmacies. The prices here do not account for what the cost may be with help from insurance (it may be cheaper with your insurance), but the website can give you the price if you did not use any insurance.  - You can print the associated coupon and take it with your prescription to the pharmacy.  - You may also stop by our office during regular business hours and pick up a GoodRx coupon card.  - If you need your prescription sent electronically to a different pharmacy, notify our office through [facility] or by phone at [phone]

## 2023-10-04 ENCOUNTER — Ambulatory Visit
Admission: RE | Admit: 2023-10-04 | Discharge: 2023-10-04 | Disposition: A | Discharge status: Home / Self Care | Payer: Federal, State, Local not specified - PPO | Source: Ambulatory Visit | Attending: Obstetrics and Gynecology | Admitting: Obstetrics and Gynecology

## 2023-10-04 DIAGNOSIS — Z803 Family history of malignant neoplasm of breast: Secondary | ICD-10-CM | POA: Diagnosis not present

## 2023-10-04 DIAGNOSIS — R92333 Mammographic heterogeneous density, bilateral breasts: Secondary | ICD-10-CM

## 2023-10-04 DIAGNOSIS — Z1239 Encounter for other screening for malignant neoplasm of breast: Secondary | ICD-10-CM | POA: Diagnosis not present

## 2023-10-04 MED ORDER — GADOPICLENOL 0.5 MMOL/ML IV SOLN
7.0000 mL | Freq: Once | INTRAVENOUS | Status: AC | PRN
  Administered 2023-10-04: 7 mL via INTRAVENOUS

## 2023-10-09 NOTE — Telephone Encounter (Signed)
 You can send tretinoin 0.025% cream, apply BIW, she can use cerave mositurizer

## 2023-10-11 ENCOUNTER — Other Ambulatory Visit: Payer: Self-pay

## 2023-10-11 MED ORDER — TRETINOIN 0.025 % EX CREA: TOPICAL_CREAM | Freq: Every day | CUTANEOUS | 2 refills | Status: AC

## 2024-02-11 ENCOUNTER — Other Ambulatory Visit: Payer: Self-pay | Admitting: Family Medicine

## 2024-02-11 ENCOUNTER — Telehealth: Payer: Self-pay

## 2024-02-11 DIAGNOSIS — Z1231 Encounter for screening mammogram for malignant neoplasm of breast: Secondary | ICD-10-CM

## 2024-02-11 NOTE — Telephone Encounter (Signed)
 LMOM informing Pt that she had bone density in January 2025, next not eligible until January 2027.

## 2024-02-11 NOTE — Telephone Encounter (Signed)
 Copied from CRM [id number]. Topic: Clinical - Request for Lab/Test Order >> Feb 11, 2024  9:58 AM Rea ORN wrote: Reason for CRM: Pt would like order for bone density test. Please call back once placed [phone].

## 2024-03-28 DIAGNOSIS — Z09 Encounter for follow-up examination after completed treatment for conditions other than malignant neoplasm: Secondary | ICD-10-CM | POA: Diagnosis not present

## 2024-03-28 DIAGNOSIS — D122 Benign neoplasm of ascending colon: Secondary | ICD-10-CM | POA: Diagnosis not present

## 2024-03-28 DIAGNOSIS — Z860101 Personal history of adenomatous and serrated colon polyps: Secondary | ICD-10-CM | POA: Diagnosis not present

## 2024-03-28 DIAGNOSIS — K5730 Diverticulosis of large intestine without perforation or abscess without bleeding: Secondary | ICD-10-CM | POA: Diagnosis not present

## 2024-03-28 DIAGNOSIS — K649 Unspecified hemorrhoids: Secondary | ICD-10-CM | POA: Diagnosis not present

## 2024-03-28 DIAGNOSIS — D124 Benign neoplasm of descending colon: Secondary | ICD-10-CM | POA: Diagnosis not present

## 2024-03-28 LAB — HM COLONOSCOPY

## 2024-04-07 ENCOUNTER — Ambulatory Visit

## 2024-04-25 ENCOUNTER — Ambulatory Visit
Admission: RE | Admit: 2024-04-25 | Discharge: 2024-04-25 | Disposition: A | Source: Ambulatory Visit | Attending: Family Medicine | Admitting: Family Medicine

## 2024-04-25 DIAGNOSIS — Z1231 Encounter for screening mammogram for malignant neoplasm of breast: Secondary | ICD-10-CM

## 2024-04-29 ENCOUNTER — Ambulatory Visit: Payer: Self-pay | Admitting: Family Medicine

## 2024-05-08 ENCOUNTER — Other Ambulatory Visit: Payer: Self-pay | Admitting: Obstetrics and Gynecology

## 2024-05-08 ENCOUNTER — Other Ambulatory Visit: Payer: Self-pay | Admitting: Family Medicine

## 2024-05-08 DIAGNOSIS — Z9189 Other specified personal risk factors, not elsewhere classified: Secondary | ICD-10-CM

## 2024-05-08 DIAGNOSIS — M810 Age-related osteoporosis without current pathological fracture: Secondary | ICD-10-CM

## 2024-05-12 ENCOUNTER — Encounter: Admitting: Family Medicine

## 2024-05-14 ENCOUNTER — Encounter: Payer: Self-pay | Admitting: Family Medicine

## 2024-06-03 ENCOUNTER — Encounter: Admitting: Family Medicine
